# Patient Record
Sex: Female | Born: 1975 | Race: White | Hispanic: No | Marital: Single | Smoking: Current every day smoker
Health system: Southern US, Community
[De-identification: ages and names within clinical notes are randomized; demographics above are authoritative.]

## PROBLEM LIST (undated history)

## (undated) DIAGNOSIS — Z3490 Encounter for supervision of normal pregnancy, unspecified, unspecified trimester: Secondary | ICD-10-CM

## (undated) DIAGNOSIS — F1410 Cocaine abuse, uncomplicated: Secondary | ICD-10-CM

## (undated) DIAGNOSIS — F329 Major depressive disorder, single episode, unspecified: Secondary | ICD-10-CM

## (undated) DIAGNOSIS — B009 Herpesviral infection, unspecified: Secondary | ICD-10-CM

## (undated) DIAGNOSIS — Z8619 Personal history of other infectious and parasitic diseases: Secondary | ICD-10-CM

## (undated) DIAGNOSIS — B999 Unspecified infectious disease: Secondary | ICD-10-CM

## (undated) DIAGNOSIS — F32A Depression, unspecified: Secondary | ICD-10-CM

## (undated) HISTORY — DX: Unspecified infectious disease: B99.9

## (undated) HISTORY — DX: Personal history of other infectious and parasitic diseases: Z86.19

## (undated) HISTORY — DX: Herpesviral infection, unspecified: B00.9

## (undated) HISTORY — PX: ABDOMINAL SURGERY: SHX537

## (undated) HISTORY — PX: ANKLE SURGERY: SHX546

---

## 2007-01-28 ENCOUNTER — Inpatient Hospital Stay (HOSPITAL_COMMUNITY): Admission: AD | Admit: 2007-01-28 | Discharge: 2007-01-30 | Payer: Self-pay | Admitting: Psychiatry

## 2007-01-28 ENCOUNTER — Ambulatory Visit: Payer: Self-pay | Admitting: Psychiatry

## 2008-12-12 ENCOUNTER — Emergency Department (HOSPITAL_COMMUNITY): Admission: EM | Admit: 2008-12-12 | Discharge: 2008-12-12 | Payer: Self-pay | Admitting: Emergency Medicine

## 2010-02-28 ENCOUNTER — Other Ambulatory Visit: Admission: RE | Admit: 2010-02-28 | Discharge: 2010-02-28 | Payer: Self-pay | Admitting: Obstetrics & Gynecology

## 2010-03-22 ENCOUNTER — Inpatient Hospital Stay (HOSPITAL_COMMUNITY): Admission: AD | Admit: 2010-03-22 | Discharge: 2010-03-25 | Payer: Self-pay | Admitting: Obstetrics & Gynecology

## 2010-03-22 ENCOUNTER — Ambulatory Visit: Payer: Self-pay | Admitting: Family

## 2010-03-22 ENCOUNTER — Inpatient Hospital Stay (HOSPITAL_COMMUNITY): Admission: AD | Admit: 2010-03-22 | Discharge: 2010-03-22 | Payer: Self-pay | Admitting: Family Medicine

## 2011-02-07 LAB — CBC
HCT: 27 % — ABNORMAL LOW (ref 36.0–46.0)
HCT: 36.3 % (ref 36.0–46.0)
Hemoglobin: 9.5 g/dL — ABNORMAL LOW (ref 12.0–15.0)
MCHC: 35 g/dL (ref 30.0–36.0)
Platelets: 244 10*3/uL (ref 150–400)
RBC: 3 MIL/uL — ABNORMAL LOW (ref 3.87–5.11)
RBC: 4.08 MIL/uL (ref 3.87–5.11)
RDW: 15.3 % (ref 11.5–15.5)
WBC: 15.5 10*3/uL — ABNORMAL HIGH (ref 4.0–10.5)

## 2011-03-06 LAB — GC/CHLAMYDIA PROBE AMP, GENITAL
Chlamydia, DNA Probe: NEGATIVE
GC Probe Amp, Genital: NEGATIVE

## 2011-03-06 LAB — RPR: RPR Ser Ql: NONREACTIVE

## 2011-03-06 LAB — URINALYSIS, ROUTINE W REFLEX MICROSCOPIC
Glucose, UA: NEGATIVE mg/dL
Hgb urine dipstick: NEGATIVE
Nitrite: NEGATIVE
pH: 5.5 (ref 5.0–8.0)

## 2011-03-06 LAB — URINE CULTURE

## 2011-03-06 LAB — WET PREP, GENITAL
WBC, Wet Prep HPF POC: NONE SEEN
Yeast Wet Prep HPF POC: NONE SEEN

## 2011-07-04 ENCOUNTER — Emergency Department (HOSPITAL_COMMUNITY)
Admission: EM | Admit: 2011-07-04 | Discharge: 2011-07-04 | Disposition: A | Discharge status: Home / Self Care | Payer: Medicaid Other | Attending: Emergency Medicine | Admitting: Emergency Medicine

## 2011-07-04 ENCOUNTER — Encounter: Payer: Self-pay | Admitting: *Deleted

## 2011-07-04 DIAGNOSIS — X58XXXA Exposure to other specified factors, initial encounter: Secondary | ICD-10-CM | POA: Insufficient documentation

## 2011-07-04 DIAGNOSIS — IMO0002 Reserved for concepts with insufficient information to code with codable children: Secondary | ICD-10-CM | POA: Insufficient documentation

## 2011-07-04 DIAGNOSIS — F17200 Nicotine dependence, unspecified, uncomplicated: Secondary | ICD-10-CM | POA: Insufficient documentation

## 2011-07-04 NOTE — ED Provider Notes (Signed)
Medical screening examination/treatment/procedure(s) were performed by non-physician practitioner and as supervising physician I was immediately available for consultation/collaboration.   Vida Roller, MD 07/04/11 [phone]

## 2011-07-04 NOTE — ED Notes (Signed)
PA in with pt 

## 2011-07-04 NOTE — ED Notes (Signed)
Pt c/o right arm pain. Pt states it feels like she pulled a muscle.

## 2011-07-04 NOTE — ED Provider Notes (Signed)
History     CSN: [id number] Arrival date & time: 07/04/2011 10:44 AM  Chief Complaint  Patient presents with  . Arm Pain    right arm   Patient is a 35 y.o. female presenting with arm pain. The history is provided by the patient.  Arm Pain This is a new problem. The problem has been gradually worsening. Associated symptoms include arthralgias. Pertinent negatives include no abdominal pain, chest pain, coughing, fever, joint swelling or neck pain. Exacerbated by: certain movement. She has tried acetaminophen and NSAIDs for the symptoms. The treatment provided no relief.    History reviewed. No pertinent past medical history.  Past Surgical History  Procedure Date  . Ankle surgery   . Abdominal surgery     History reviewed. No pertinent family history.  History  Substance Use Topics  . Smoking status: Current Everyday Smoker -- 0.5 packs/day  . Smokeless tobacco: Not on file  . Alcohol Use: No    OB History    Grav Para Term Preterm Abortions TAB SAB Ect Mult Living                  Review of Systems  Constitutional: Negative for fever and activity change.       All ROS Neg except as noted in HPI  HENT: Negative for nosebleeds and neck pain.   Eyes: Negative for photophobia and discharge.  Respiratory: Negative for cough, shortness of breath and wheezing.   Cardiovascular: Negative for chest pain and palpitations.  Gastrointestinal: Negative for abdominal pain and blood in stool.  Genitourinary: Negative for dysuria, frequency and hematuria.  Musculoskeletal: Positive for arthralgias. Negative for back pain and joint swelling.  Skin: Negative.   Neurological: Negative for dizziness, seizures and speech difficulty.  Psychiatric/Behavioral: Negative for hallucinations and confusion.    Physical Exam  BP 106/71  Pulse 76  Temp(Src) 97.7 F (36.5 C) (Oral)  Resp 20  Ht 5\' 3"  (1.6 m)  Wt 155 lb (70.308 kg)  BMI 27.46 kg/m2  SpO2 100%  LMP 06/14/2011  Physical  Exam  Nursing note and vitals reviewed. Constitutional: She is oriented to person, place, and time. She appears well-developed and well-nourished.  Non-toxic appearance.  HENT:  Head: Normocephalic.  Right Ear: Tympanic membrane and external ear normal.  Left Ear: Tympanic membrane and external ear normal.  Eyes: EOM and lids are normal. Pupils are equal, round, and reactive to light.  Neck: Normal range of motion. Neck supple. Carotid bruit is not present.       No carotid bruits.  Cardiovascular: Normal rate, regular rhythm, normal heart sounds, intact distal pulses and normal pulses.   Pulmonary/Chest: Breath sounds normal. No respiratory distress.  Abdominal: Soft. Bowel sounds are normal. There is no tenderness. There is no guarding.  Musculoskeletal: Normal range of motion.       Pain with stressing the right shoulder and upper arm. No dislocation. Good cap refill of the right upper ext. FROM of the fingers, wrist and elbow.  Lymphadenopathy:       Head (right side): No submandibular adenopathy present.       Head (left side): No submandibular adenopathy present.    She has no cervical adenopathy.  Neurological: She is alert and oriented to person, place, and time. She has normal strength. No cranial nerve deficit or sensory deficit.  Skin: Skin is warm and dry.  Psychiatric: She has a normal mood and affect. Her speech is normal.    ED Course  Procedures  MDM I have reviewed nursing notes, vital signs, and all appropriate lab and imaging results for this patient.      Kathie Dike, [place] 07/04/11 1124

## 2011-07-19 ENCOUNTER — Emergency Department (HOSPITAL_COMMUNITY)
Admission: EM | Admit: 2011-07-19 | Discharge: 2011-07-19 | Disposition: A | Discharge status: Home / Self Care | Payer: Medicaid Other | Attending: Emergency Medicine | Admitting: Emergency Medicine

## 2011-07-19 ENCOUNTER — Encounter (HOSPITAL_COMMUNITY): Payer: Self-pay | Admitting: Emergency Medicine

## 2011-07-19 DIAGNOSIS — T07XXXA Unspecified multiple injuries, initial encounter: Secondary | ICD-10-CM

## 2011-07-19 DIAGNOSIS — W19XXXA Unspecified fall, initial encounter: Secondary | ICD-10-CM | POA: Insufficient documentation

## 2011-07-19 MED ORDER — IBUPROFEN 800 MG PO TABS: 800.0000 mg | ORAL_TABLET | Freq: Three times a day (TID) | ORAL | Status: AC

## 2011-07-19 MED ORDER — HYDROCODONE-ACETAMINOPHEN 5-325 MG PO TABS: ORAL_TABLET | ORAL | Status: DC

## 2011-07-19 NOTE — ED Notes (Signed)
Patient reports fall this morning down seven stairs at home. Patient reports losing her balance. Denies LOC. States she hit the right side of her head on concrete. Patient reports back pain and left foot/ankle pain. Patient reports she got dizzy at work after the fall. Denies nausea, vomiting, denies vision changes. Patient alert/oriented.

## 2011-07-19 NOTE — ED Notes (Deleted)
Re: visit from 07/04/11  Medical screening examination/treatment/procedure(s) were performed by non-physician practitioner and as supervising physician I was immediately available for consultation/collaboration.   Vida Roller, MD 07/19/11 [phone]

## 2011-11-17 ENCOUNTER — Emergency Department (HOSPITAL_COMMUNITY): Admission: EM | Admit: 2011-11-17 | Discharge: 2011-11-17 | Discharge status: Against Medical Advice | Payer: Self-pay

## 2011-11-21 NOTE — L&D Delivery Note (Signed)
Delivery Note At 2:22 AM a viable female was delivered via  (Presentation:ROA).  APGAR: 7/9, ; weight .  6# 7oz Placenta status: intact, spontaneous .  Cord: tight Nuchal x1, body cord x1, 3 vessel cord   thick mec at delivery.   Maternal UDS + cocaine on the 10/27  Anesthesia: epidural   Episiotomy: None Lacerations: rt. Labial abrasion Suture Repair: none\ Est. Blood Loss (mL):  Mom to postpartum.  Baby to mother's chest. Needed stimulation and blow-by O2. NICU called to room.   Kuneff, Renee 09/18/2012, 2:40 AM

## 2012-01-29 ENCOUNTER — Emergency Department (HOSPITAL_COMMUNITY)
Admission: EM | Admit: 2012-01-29 | Discharge: 2012-01-29 | Disposition: A | Discharge status: Home / Self Care | Payer: Self-pay | Attending: Emergency Medicine | Admitting: Emergency Medicine

## 2012-01-29 ENCOUNTER — Encounter (HOSPITAL_COMMUNITY): Payer: Self-pay | Admitting: *Deleted

## 2012-01-29 ENCOUNTER — Emergency Department (HOSPITAL_COMMUNITY): Payer: Self-pay

## 2012-01-29 DIAGNOSIS — O210 Mild hyperemesis gravidarum: Secondary | ICD-10-CM | POA: Insufficient documentation

## 2012-01-29 DIAGNOSIS — M545 Low back pain, unspecified: Secondary | ICD-10-CM | POA: Insufficient documentation

## 2012-01-29 DIAGNOSIS — R10814 Left lower quadrant abdominal tenderness: Secondary | ICD-10-CM | POA: Insufficient documentation

## 2012-01-29 DIAGNOSIS — R10813 Right lower quadrant abdominal tenderness: Secondary | ICD-10-CM | POA: Insufficient documentation

## 2012-01-29 DIAGNOSIS — A5400 Gonococcal infection of lower genitourinary tract, unspecified: Secondary | ICD-10-CM | POA: Insufficient documentation

## 2012-01-29 DIAGNOSIS — O2340 Unspecified infection of urinary tract in pregnancy, unspecified trimester: Secondary | ICD-10-CM

## 2012-01-29 DIAGNOSIS — O26899 Other specified pregnancy related conditions, unspecified trimester: Secondary | ICD-10-CM

## 2012-01-29 DIAGNOSIS — N390 Urinary tract infection, site not specified: Secondary | ICD-10-CM | POA: Insufficient documentation

## 2012-01-29 DIAGNOSIS — F17200 Nicotine dependence, unspecified, uncomplicated: Secondary | ICD-10-CM | POA: Insufficient documentation

## 2012-01-29 DIAGNOSIS — R059 Cough, unspecified: Secondary | ICD-10-CM | POA: Insufficient documentation

## 2012-01-29 DIAGNOSIS — R450 Nervousness: Secondary | ICD-10-CM | POA: Insufficient documentation

## 2012-01-29 DIAGNOSIS — O98219 Gonorrhea complicating pregnancy, unspecified trimester: Secondary | ICD-10-CM | POA: Insufficient documentation

## 2012-01-29 DIAGNOSIS — R05 Cough: Secondary | ICD-10-CM | POA: Insufficient documentation

## 2012-01-29 DIAGNOSIS — O2390 Unspecified genitourinary tract infection in pregnancy, unspecified trimester: Secondary | ICD-10-CM | POA: Insufficient documentation

## 2012-01-29 DIAGNOSIS — R109 Unspecified abdominal pain: Secondary | ICD-10-CM | POA: Insufficient documentation

## 2012-01-29 DIAGNOSIS — N949 Unspecified condition associated with female genital organs and menstrual cycle: Secondary | ICD-10-CM | POA: Insufficient documentation

## 2012-01-29 DIAGNOSIS — R42 Dizziness and giddiness: Secondary | ICD-10-CM | POA: Insufficient documentation

## 2012-01-29 LAB — CBC
Hemoglobin: 13.2 g/dL (ref 12.0–15.0)
MCH: 29.7 pg (ref 26.0–34.0)
MCV: 88.1 fL (ref 78.0–100.0)
RBC: 4.44 MIL/uL (ref 3.87–5.11)
RDW: 14.3 % (ref 11.5–15.5)

## 2012-01-29 LAB — ABO/RH: ABO/RH(D): A POS

## 2012-01-29 LAB — URINALYSIS, ROUTINE W REFLEX MICROSCOPIC
Nitrite: POSITIVE — AB
Specific Gravity, Urine: >1.03 — ABNORMAL HIGH (ref 1.005–1.030)
Urobilinogen, UA: 0.2 mg/dL (ref 0.0–1.0)

## 2012-01-29 LAB — DIFFERENTIAL
Basophils Absolute: 0 10*3/uL (ref 0.0–0.1)
Eosinophils Absolute: 0.1 10*3/uL (ref 0.0–0.7)
Lymphs Abs: 1.7 10*3/uL (ref 0.7–4.0)
Monocytes Relative: 11 % (ref 3–12)
Neutrophils Relative %: 66 % (ref 43–77)

## 2012-01-29 LAB — PREGNANCY, URINE: Preg Test, Ur: POSITIVE — AB

## 2012-01-29 LAB — WET PREP, GENITAL

## 2012-01-29 LAB — URINE MICROSCOPIC-ADD ON

## 2012-01-29 MED ORDER — NITROFURANTOIN MONOHYD MACRO 100 MG PO CAPS: 100.0000 mg | ORAL_CAPSULE | Freq: Two times a day (BID) | ORAL | Status: AC

## 2012-01-29 NOTE — ED Notes (Signed)
Pt taken to US. Nad.  

## 2012-01-29 NOTE — ED Notes (Signed)
Positive preg test at home.  Low abd pain.  Alert,

## 2012-01-29 NOTE — ED Provider Notes (Signed)
History     CSN: [id number]  Arrival date & time 01/29/12  1432   First MD Initiated Contact with Patient 01/29/12 1529      Chief Complaint  Patient presents with  . Abdominal Pain    HPI Joy Juarez is a 36 y.o. female who presents to the ED for abdominal pain in early pregnancy. The pain started last week and felt like period cramps. Denies vaginal bleeding but has had discharge. Has had a few episodes of nausea and vomiting. Having breast tenderness and low back pain. LMP 12/15/11. The history was provided by the patient. History reviewed. No pertinent past medical history.  Past Surgical History  Procedure Date  . Ankle surgery   . Abdominal surgery     History reviewed. No pertinent family history.  History  Substance Use Topics  . Smoking status: Current Everyday Smoker -- 0.5 packs/day  . Smokeless tobacco: Not on file  . Alcohol Use: No    OB History    Grav Para Term Preterm Abortions TAB SAB Ect Mult Living                  Review of Systems  Constitutional: Negative for fever, chills, diaphoresis and fatigue.  HENT: Negative for ear pain, congestion, sore throat, facial swelling, neck pain, neck stiffness, dental problem and sinus pressure.   Eyes: Negative for photophobia, pain and discharge.  Respiratory: Positive for cough. Negative for chest tightness and wheezing.   Cardiovascular: Negative.   Gastrointestinal: Positive for nausea, vomiting and abdominal pain. Negative for diarrhea, constipation and abdominal distention.  Genitourinary: Positive for frequency, vaginal discharge and pelvic pain. Negative for dysuria, flank pain, vaginal bleeding and difficulty urinating.  Musculoskeletal: Positive for back pain. Negative for myalgias and gait problem.  Skin: Negative for color change and rash.  Neurological: Positive for dizziness. Negative for speech difficulty, weakness, light-headedness, numbness and headaches.  Psychiatric/Behavioral: Negative for  confusion and agitation. The patient is nervous/anxious.     Allergies  Penicillins  Home Medications   Current Outpatient Rx  Name Route Sig Dispense Refill  . DROSPIRENONE-ETHINYL ESTRADIOL 3-0.03 MG PO TABS Oral Take 1 tablet by mouth daily.      Marland Kitchen HYDROCODONE-ACETAMINOPHEN 5-325 MG PO TABS  1 or 2 po q4h prn pain 15 tablet 0  . ONE-DAILY MULTI VITAMINS PO TABS Oral Take 1 tablet by mouth daily.        BP 96/71  Pulse 98  Temp(Src) 98 F (36.7 C) (Oral)  Resp 20  Ht 5\' 3"  (1.6 m)  Wt 125 lb (56.7 kg)  BMI 22.14 kg/m2  SpO2 100%  LMP 12/10/2011  Physical Exam  Nursing note and vitals reviewed. Constitutional: She is oriented to person, place, and time. She appears well-developed and well-nourished.  HENT:  Head: Normocephalic.  Eyes: EOM are normal.  Neck: Neck supple.  Cardiovascular: Normal rate.   Pulmonary/Chest: Effort normal.  Abdominal: Soft. There is tenderness in the right lower quadrant and left lower quadrant. There is no rigidity, no rebound, no guarding and no CVA tenderness.  Genitourinary:       External genitalia without lesions. White discharge vaginal vault. Cervix closed, long, no CMT, no adnexal tenderness. Uterus slightly enlarged.  Musculoskeletal: Normal range of motion.  Neurological: She is alert and oriented to person, place, and time. No cranial nerve deficit.  Skin: Skin is warm and dry.  Psychiatric: Her behavior is normal. Judgment and thought content normal.   Results for  orders placed during the hospital encounter of 01/29/12 (from the past 24 hour(s))  URINALYSIS, ROUTINE W REFLEX MICROSCOPIC     Status: Abnormal   Collection Time   01/29/12  2:49 PM      Component Value Range   Color, Urine YELLOW  YELLOW    APPearance HAZY (*) CLEAR    Specific Gravity, Urine >1.030 (*) 1.005 - 1.030    pH 5.5  5.0 - 8.0    Glucose, UA NEGATIVE  NEGATIVE (mg/dL)   Hgb urine dipstick NEGATIVE  NEGATIVE    Bilirubin Urine NEGATIVE  NEGATIVE     Ketones, ur NEGATIVE  NEGATIVE (mg/dL)   Protein, ur NEGATIVE  NEGATIVE (mg/dL)   Urobilinogen, UA 0.2  0.0 - 1.0 (mg/dL)   Nitrite POSITIVE (*) NEGATIVE    Leukocytes, UA TRACE (*) NEGATIVE   PREGNANCY, URINE     Status: Abnormal   Collection Time   01/29/12  2:49 PM      Component Value Range   Preg Test, Ur POSITIVE (*) NEGATIVE   URINE MICROSCOPIC-ADD ON     Status: Abnormal   Collection Time   01/29/12  2:49 PM      Component Value Range   Squamous Epithelial / LPF FEW (*) RARE    WBC, UA 11-20  <3 (WBC/hpf)   Bacteria, UA MANY (*) RARE   CBC     Status: Normal   Collection Time   01/29/12  3:47 PM      Component Value Range   WBC 7.7  4.0 - 10.5 (K/uL)   RBC 4.44  3.87 - 5.11 (MIL/uL)   Hemoglobin 13.2  12.0 - 15.0 (g/dL)   HCT 16.1  09.6 - 04.5 (%)   MCV 88.1  78.0 - 100.0 (fL)   MCH 29.7  26.0 - 34.0 (pg)   MCHC 33.8  30.0 - 36.0 (g/dL)   RDW 40.9  81.1 - 91.4 (%)   Platelets 256  150 - 400 (K/uL)  DIFFERENTIAL     Status: Normal   Collection Time   01/29/12  3:47 PM      Component Value Range   Neutrophils Relative 66  43 - 77 (%)   Neutro Abs 5.1  1.7 - 7.7 (K/uL)   Lymphocytes Relative 22  12 - 46 (%)   Lymphs Abs 1.7  0.7 - 4.0 (K/uL)   Monocytes Relative 11  3 - 12 (%)   Monocytes Absolute 0.8  0.1 - 1.0 (K/uL)   Eosinophils Relative 1  0 - 5 (%)   Eosinophils Absolute 0.1  0.0 - 0.7 (K/uL)   Basophils Relative 0  0 - 1 (%)   Basophils Absolute 0.0  0.0 - 0.1 (K/uL)  HCG, QUANTITATIVE, PREGNANCY     Status: Abnormal   Collection Time   01/29/12  3:47 PM      Component Value Range   hCG, Beta Chain, Quant, S 18227 (*) <5 (mIU/mL)  WET PREP, GENITAL     Status: Abnormal   Collection Time   01/29/12  4:05 PM      Component Value Range   Yeast Wet Prep HPF POC NONE SEEN  NONE SEEN    Trich, Wet Prep NONE SEEN  NONE SEEN    Clue Cells Wet Prep HPF POC FEW (*) NONE SEEN    WBC, Wet Prep HPF POC MANY (*) NONE SEEN     US Ob Comp Less 14 Wks  01/29/2012   *RADIOLOGY REPORT*  Clinical Data: Pelvic pain with positive pregnancy test.  OBSTETRIC <14 WK [place] AND TRANSVAGINAL OB US  Technique:  Both transabdominal and transvaginal ultrasound examinations were performed for complete evaluation of the gestation as well as the maternal uterus, adnexal regions, and pelvic cul-de-sac.  Transvaginal technique was performed to assess early pregnancy.  Comparison:  None.  Intrauterine gestational sac:  Single. Yolk sac: Visualized Embryo: Visualized Cardiac Activity: Visualized Heart Rate: 101 bpm  CRL: 4.3   mm  6   w  1.   d        [place] EDC: 09/22/2012  Maternal uterus/adnexae: Moderate subchorionic hemorrhage is identified.  Right-sided corpus luteum cyst is identified.  Left ovary is unremarkable.  There is a tiny amount of simple appearing free fluid posterior to the uterine fundus.  IMPRESSION: Single living intrauterine gestation at 6-week-1-day estimated gestational age.  Moderate subchorionic hemorrhage.  Original Report Authenticated By: ERIC A. MANSELL, M.D.   US Ob Transvaginal  01/29/2012  *RADIOLOGY REPORT*  Clinical Data: Pelvic pain with positive pregnancy test.  OBSTETRIC <14 WK [place] AND TRANSVAGINAL OB US  Technique:  Both transabdominal and transvaginal ultrasound examinations were performed for complete evaluation of the gestation as well as the maternal uterus, adnexal regions, and pelvic cul-de-sac.  Transvaginal technique was performed to assess early pregnancy.  Comparison:  None.  Intrauterine gestational sac:  Single. Yolk sac: Visualized Embryo: Visualized Cardiac Activity: Visualized Heart Rate: 101 bpm  CRL: 4.3   mm  6   w  1.   d        [place] EDC: 09/22/2012  Maternal uterus/adnexae: Moderate subchorionic hemorrhage is identified.  Right-sided corpus luteum cyst is identified.  Left ovary is unremarkable.  There is a tiny amount of simple appearing free fluid posterior to the uterine fundus.  IMPRESSION: Single living intrauterine gestation at 6-week-1-day  estimated gestational age.  Moderate subchorionic hemorrhage.  Original Report Authenticated By: ERIC A. MANSELL, M.D.   Assessment:  Viable IUP   UTI  Plan:  Marcobid Rx   Start prenatal care   Start prenatal vitamins   Pregnancy verification letter  ED Course  Procedures    MDM          Janne Napoleon, NP 01/29/12 1650

## 2012-01-29 NOTE — Discharge Instructions (Signed)
Start your prenatal care and prenatal vitamins.      ________________________________________     To schedule your Maternity Eligibility Appointment, please call [phone].  When you arrive for your appointment you must bring the following items or information listed below.  Your appointment will be rescheduled if you do not have these items or are 15 minutes late. If currently receiving Medicaid, you MUST bring: 1. Medicaid Card 2. Social Security Card 3. Picture ID 4. Proof of Pregnancy 5. Verification of current address if the address on Medicaid card is incorrect "postmarked mail" If not receiving Medicaid, you MUST bring: 1. Social Security Card 2. Picture ID 3. Birth Certificate (if available) Passport or *Green Card 4. Proof of Pregnancy 5. Verification of current address "postmarked mail" for each income presented. 6. Verification of insurance coverage, if any 7. Check stubs from each employer for the previous month (if unable to present check stub  for each week, we will accept check stub for the first and last week ill the same month.) If you can't locate check stubs, you must bring a letter from the employer(s) and it must have the following information on letterhead, typed, in English: o name of company o company telephone number o how long been with the company, if less than one month o how much person earns per hour o how many hours per week work o the gross pay the person earned for the previous month If you are 34 years old or less, you do not have to bring proof of income unless you work or live with the father of the baby and at that time we will need proof of income from you and/or the father of the baby. Green Card recipients are eligible for Medicaid for Pregnant Women (MPW)    Abdominal Pain During Pregnancy Abdominal discomfort is common in pregnancy. Most of the time, it does not cause harm. There are many causes of abdominal pain. Some causes are more  serious than others. Some of the causes of abdominal pain in pregnancy are easily diagnosed. Occasionally, the diagnosis takes time to understand. Other times, the cause is not determined. Abdominal pain can be a sign that something is very wrong with the pregnancy, or the pain may have nothing to do with the pregnancy at all. For this reason, always tell your caregiver if you have any abdominal discomfort. CAUSES Common and harmless causes of abdominal pain include:  Constipation.   Excess gas and bloating.   Round ligament pain. This is pain that is felt in the folds of the groin.   The position the baby or placenta is in.   Baby kicks.   Braxton-Hicks contractions. These are mild contractions that do not cause cervical dilation.  Serious causes of abdominal pain include:  Ectopic pregnancy. This happens when a fertilized egg implants outside of the uterus.   Miscarriage.   Preterm labor. This is when labor starts at less than 37 weeks of pregnancy.   Placental abruption. This is when the placenta partially or completely separates from the uterus.   Preeclampsia. This is often associated with high blood pressure and has been referred to as "toxemia in pregnancy."   Uterine or amniotic fluid infections.  Causes unrelated to pregnancy include:  Urinary tract infection.   Gallbladder stones or inflammation.   Hepatitis or other liver illness.   Intestinal problems, stomach flu, food poisoning, or ulcer.   Appendicitis.   Kidney (renal) stones.   Kidney infection (pylonephritis).  HOME CARE INSTRUCTIONS  For mild pain:  Do not have sexual intercourse or put anything in your vagina until your symptoms go away completely.   Get plenty of rest until your pain improves. If your pain does not improve in 1 hour, call your caregiver.   Drink clear fluids if you feel nauseous. Avoid solid food as long as you are uncomfortable or nauseous.   Only take medicine as directed by  your caregiver.   Keep all follow-up appointments with your caregiver.  SEEK IMMEDIATE MEDICAL CARE IF:  You are bleeding, leaking fluid, or passing tissue from the vagina.   You have increasing pain or cramping.   You have persistent vomiting.   You have painful or bloody urination.   You have a fever.   You notice a decrease in your baby's movements.   You have extreme weakness or feel faint.   You have shortness of breath, with or without abdominal pain.   You develop a severe headache with abdominal pain.   You have abnormal vaginal discharge with abdominal pain.   You have persistent diarrhea.   You have abdominal pain that continues even after rest, or gets worse.  MAKE SURE YOU:   Understand these instructions.   Will watch your condition.   Will get help right away if you are not doing well or get worse.  Document Released: 11/06/2005 Document Revised: 10/26/2011 Document Reviewed: 06/02/2011 [facility] Patient Information 2012 [place], [place].

## 2012-01-31 LAB — URINE CULTURE: Colony Count: >=100000

## 2012-01-31 LAB — GC/CHLAMYDIA PROBE AMP, GENITAL: GC Probe Amp, Genital: POSITIVE — AB

## 2012-01-31 NOTE — ED Provider Notes (Signed)
Medical screening examination/treatment/procedure(s) were performed by non-physician practitioner and as supervising physician I was immediately available for consultation/collaboration.  Nicoletta Dress. Colon Branch, MD 01/31/12 [id number]

## 2012-02-01 NOTE — ED Notes (Addendum)
+  Urine/positive GC Patient treated with Macrobid-sensitive to same-chart appended per protocol MD.Chart sent to EDP office for  STD rx.

## 2012-02-04 NOTE — ED Notes (Signed)
Chart returned from EDP office. Recommended that patient returns to ED for Rocephin 250 mg IM single injection for treatment of Gonorrhea. Follow up with OB/GYN for pregnancy. Continue Macrobid for UTI. Reviewed by Fayrene Helper PA-C. Attempted to call patient. No answer. Left message for patient to call back.

## 2012-02-05 NOTE — ED Notes (Signed)
Left message with contact person for patient to return call.

## 2012-03-06 ENCOUNTER — Encounter (HOSPITAL_COMMUNITY): Payer: Self-pay | Admitting: *Deleted

## 2012-03-06 ENCOUNTER — Emergency Department (HOSPITAL_COMMUNITY)
Admission: EM | Admit: 2012-03-06 | Discharge: 2012-03-06 | Discharge status: Against Medical Advice | Payer: Self-pay | Attending: Emergency Medicine | Admitting: Emergency Medicine

## 2012-03-06 DIAGNOSIS — O210 Mild hyperemesis gravidarum: Secondary | ICD-10-CM | POA: Insufficient documentation

## 2012-03-06 NOTE — ED Notes (Signed)
Pt reports she is [redacted] weeks pregnant and has been unable to keep solid food down for 3 days

## 2012-03-25 ENCOUNTER — Other Ambulatory Visit (HOSPITAL_COMMUNITY)
Admission: RE | Admit: 2012-03-25 | Discharge: 2012-03-25 | Disposition: A | Discharge status: Home / Self Care | Payer: Medicaid Other | Source: Ambulatory Visit | Attending: Obstetrics and Gynecology | Admitting: Obstetrics and Gynecology

## 2012-03-25 ENCOUNTER — Other Ambulatory Visit (HOSPITAL_COMMUNITY)
Admission: RE | Admit: 2012-03-25 | Payer: Medicaid Other | Source: Ambulatory Visit | Admitting: Obstetrics and Gynecology

## 2012-03-25 ENCOUNTER — Other Ambulatory Visit: Payer: Self-pay | Admitting: Adult Health

## 2012-03-25 DIAGNOSIS — Z113 Encounter for screening for infections with a predominantly sexual mode of transmission: Secondary | ICD-10-CM | POA: Insufficient documentation

## 2012-03-25 DIAGNOSIS — Z01419 Encounter for gynecological examination (general) (routine) without abnormal findings: Secondary | ICD-10-CM | POA: Insufficient documentation

## 2012-03-25 LAB — OB RESULTS CONSOLE ANTIBODY SCREEN: Antibody Screen: NEGATIVE

## 2012-03-25 LAB — OB RESULTS CONSOLE RPR: RPR: NONREACTIVE

## 2012-07-08 ENCOUNTER — Emergency Department (HOSPITAL_COMMUNITY)
Admission: EM | Admit: 2012-07-08 | Discharge: 2012-07-08 | Disposition: A | Discharge status: Home / Self Care | Payer: Medicaid Other | Attending: Emergency Medicine | Admitting: Emergency Medicine

## 2012-07-08 ENCOUNTER — Encounter (HOSPITAL_COMMUNITY): Payer: Self-pay | Admitting: *Deleted

## 2012-07-08 ENCOUNTER — Encounter (HOSPITAL_COMMUNITY): Payer: Medicaid Other | Attending: Obstetrics and Gynecology

## 2012-07-08 ENCOUNTER — Encounter (HOSPITAL_COMMUNITY): Payer: Self-pay

## 2012-07-08 VITALS — BP 110/65 | HR 110 | Temp 98.5°F | Resp 16

## 2012-07-08 DIAGNOSIS — N390 Urinary tract infection, site not specified: Secondary | ICD-10-CM | POA: Insufficient documentation

## 2012-07-08 DIAGNOSIS — B999 Unspecified infectious disease: Secondary | ICD-10-CM

## 2012-07-08 HISTORY — DX: Unspecified infectious disease: B99.9

## 2012-07-08 HISTORY — DX: Encounter for supervision of normal pregnancy, unspecified, unspecified trimester: Z34.90

## 2012-07-08 MED ORDER — DEXTROSE 5 % IV SOLN
1.0000 g | Freq: Once | INTRAVENOUS | Status: AC
  Administered 2012-07-08: 1 g via INTRAVENOUS
  Filled 2012-07-08: qty 10

## 2012-07-08 MED ORDER — SODIUM CHLORIDE 0.9 % IV SOLN
INTRAVENOUS | Status: DC
  Administered 2012-07-08: 16:00:00 via INTRAVENOUS

## 2012-07-08 MED ORDER — CEFTRIAXONE SODIUM 2 G IJ SOLR
2.0000 g | INTRAMUSCULAR | Status: DC
  Filled 2012-07-08 (×2): qty 2

## 2012-07-08 MED ORDER — SODIUM CHLORIDE 0.9 % IJ SOLN
10.0000 mL | INTRAMUSCULAR | Status: DC | PRN
  Administered 2012-07-08: 10 mL via INTRAVENOUS

## 2012-07-08 NOTE — Progress Notes (Signed)
Tolerated iv antibiotic well. 

## 2012-07-08 NOTE — ED Notes (Addendum)
Fever, chills, vomiting, dysuria, fever.  Pregnant .  Pt says she had to walk here.Goes to [employer]  No vag bleeding or d/c.

## 2012-09-03 LAB — OB RESULTS CONSOLE GBS: GBS: POSITIVE

## 2012-09-15 ENCOUNTER — Inpatient Hospital Stay (HOSPITAL_COMMUNITY)
Admission: AD | Admit: 2012-09-15 | Discharge: 2012-09-15 | Disposition: A | Discharge status: Home / Self Care | Payer: Medicaid Other | Source: Ambulatory Visit | Attending: Obstetrics & Gynecology | Admitting: Obstetrics & Gynecology

## 2012-09-15 ENCOUNTER — Encounter (HOSPITAL_COMMUNITY): Payer: Self-pay | Admitting: *Deleted

## 2012-09-15 DIAGNOSIS — O479 False labor, unspecified: Secondary | ICD-10-CM

## 2012-09-15 LAB — RAPID URINE DRUG SCREEN, HOSP PERFORMED
Barbiturates: NOT DETECTED
Benzodiazepines: NOT DETECTED
Tetrahydrocannabinol: NOT DETECTED

## 2012-09-15 MED ORDER — AZITHROMYCIN 500 MG PO TABS: 1000.0000 mg | ORAL_TABLET | Freq: Once | ORAL | Status: DC

## 2012-09-15 NOTE — MAU Note (Signed)
Pt reports having ctx since last night getting stronger and closer together now 8-10 min apart. Good fetal movement and denies SROM or bleeding at this time.

## 2012-09-15 NOTE — ED Provider Notes (Signed)
First Provider Initiated Contact with Patient 09/15/12 1056      Chief Complaint:  Labor Eval Pt feels "tightening" "every so often" Vomited chlamydia medicine a few days ago and requests retreatment Joy Juarez is  36 y.o. G2P1001 at [redacted]w[redacted]d presents complaining of Labor Eval .  She states possible contractions are associated with none vaginal bleeding, intact membranes, along with active fetal movement.   Obstetrical/Gynecological History: Menstrual History: OB History    Grav Para Term Preterm Abortions TAB SAB Ect Mult Living   2 1 1  0 0 0 0 0 0 1      Menarche age: Patient's last menstrual period was 12/07/2011.     Past Medical History: Past Medical History  Diagnosis Date  . Pregnant   . Infection 07/08/2012    Past Surgical History: Past Surgical History  Procedure Date  . Ankle surgery   . Abdominal surgery     Family History: History reviewed. No pertinent family history.  Social History: History  Substance Use Topics  . Smoking status: Current Every Day Smoker -- 0.5 packs/day  . Smokeless tobacco: Not on file  . Alcohol Use: No     Pt used crack Cocaine 3 days. Pt used Marijuana- 3 weeks ago. Pt says she uses crack every other day.     Allergies:  Allergies  Allergen Reactions  . Penicillins Nausea And Vomiting    Meds:  Prescriptions prior to admission  Medication Sig Dispense Refill  . acetaminophen (TYLENOL) 500 MG tablet Take 1,000 mg by mouth every 6 (six) hours as needed. pain      . omeprazole (PRILOSEC) 20 MG capsule Take 20 mg by mouth daily.      . valACYclovir (VALTREX) 1000 MG tablet Take 1,000 mg by mouth daily.      Marland Kitchen DISCONTD: azithromycin (ZITHROMAX) 500 MG tablet Take 1,000 mg by mouth once.        Review of Systems - Please refer to the aforementioned patients' reports.     Physical Exam  Blood pressure 114/78, pulse 91, temperature 97.6 F (36.4 C), temperature source Oral, resp. rate 18, height 5\' 3"  (1.6 m), weight  62.596 kg (138 lb), last menstrual period 12/07/2011. GENERAL: Well-developed, well-nourished female in no acute distress.  LUNGS: Clear to auscultation bilaterally.  HEART: Regular rate and rhythm. ABDOMEN: Soft, nontender, nondistended, gravid.  EXTREMITIES: Nontender, no edema, 2+ distal pulses. CERVICAL EXAM: Dilatation 3cm   Effacement 70%   Station -3   Presentation: cephalic FHT:  Baseline rate 130 bpm   Variability moderate  Accelerations present   Decelerations none Contractions: Every 10-12 mins   Labs: No results found for this or any previous visit (from the past 24 hour(s)). Imaging Studies:  No results found.  Assessment: Joy Juarez is  36 y.o. G2P1001 at [redacted]w[redacted]d presents with false vs early labor.  Pt was observen for 1.5 hours without cervical change or regular contractions.  Plan: D/c home.  Retreat with azithromycin 1 gm PO.  CRESENZO-DISHMAN,Joy Juarez 10/27/201312:27 PM   UDS + cocaine.  Pt left before results could be discussed.  Pt admits to using cocaine antenatally and is concerned that baby may be removed from home.  Pt aware that a social service consult will be initiated.  Has been counseled previously in office regarding dangers of cocaine use in pregnancy

## 2012-09-15 NOTE — ED Provider Notes (Signed)
Attestation of Attending Supervision of Advanced Practitioner (CNM/NP): Evaluation and management procedures were performed by the Advanced Practitioner under my supervision and collaboration.  I have reviewed the Advanced Practitioner's note and chart, and I agree with the management and plan.  Demont Linford, MD, FACOG Attending Obstetrician & Gynecologist Faculty Practice, [facility]  

## 2012-09-15 NOTE — Progress Notes (Signed)
Pt requests Drenda Freeze come down to check her cervix. Drenda Freeze called and is in a delivery at this time. Orders received for RN to check patients cervix if OK with patient.

## 2012-09-18 ENCOUNTER — Encounter (HOSPITAL_COMMUNITY): Payer: Self-pay

## 2012-09-18 ENCOUNTER — Encounter (HOSPITAL_COMMUNITY): Payer: Self-pay | Admitting: Anesthesiology

## 2012-09-18 ENCOUNTER — Inpatient Hospital Stay (HOSPITAL_COMMUNITY)
Admission: AD | Admit: 2012-09-18 | Discharge: 2012-09-20 | DRG: 775 | Disposition: A | Discharge status: Home / Self Care | Payer: Medicaid Other | Source: Ambulatory Visit | Attending: Obstetrics & Gynecology | Admitting: Obstetrics & Gynecology

## 2012-09-18 ENCOUNTER — Inpatient Hospital Stay (HOSPITAL_COMMUNITY): Payer: Medicaid Other | Admitting: Anesthesiology

## 2012-09-18 DIAGNOSIS — B999 Unspecified infectious disease: Secondary | ICD-10-CM

## 2012-09-18 DIAGNOSIS — O99344 Other mental disorders complicating childbirth: Secondary | ICD-10-CM

## 2012-09-18 DIAGNOSIS — Z22330 Carrier of Group B streptococcus: Secondary | ICD-10-CM

## 2012-09-18 DIAGNOSIS — O9989 Other specified diseases and conditions complicating pregnancy, childbirth and the puerperium: Secondary | ICD-10-CM

## 2012-09-18 DIAGNOSIS — IMO0001 Reserved for inherently not codable concepts without codable children: Secondary | ICD-10-CM

## 2012-09-18 DIAGNOSIS — F1410 Cocaine abuse, uncomplicated: Secondary | ICD-10-CM

## 2012-09-18 DIAGNOSIS — O99892 Other specified diseases and conditions complicating childbirth: Secondary | ICD-10-CM | POA: Diagnosis present

## 2012-09-18 LAB — CBC
Platelets: 170 10*3/uL (ref 150–400)
RBC: 4 MIL/uL (ref 3.87–5.11)
WBC: 13.9 10*3/uL — ABNORMAL HIGH (ref 4.0–10.5)

## 2012-09-18 LAB — RAPID URINE DRUG SCREEN, HOSP PERFORMED
Amphetamines: NOT DETECTED
Barbiturates: NOT DETECTED
Benzodiazepines: NOT DETECTED
Cocaine: POSITIVE — AB

## 2012-09-18 LAB — RPR: RPR Ser Ql: NONREACTIVE

## 2012-09-18 MED ORDER — PHENYLEPHRINE 40 MCG/ML (10ML) SYRINGE FOR IV PUSH (FOR BLOOD PRESSURE SUPPORT)
80.0000 ug | PREFILLED_SYRINGE | INTRAVENOUS | Status: DC | PRN
  Filled 2012-09-18: qty 5

## 2012-09-18 MED ORDER — LIDOCAINE HCL (PF) 1 % IJ SOLN
30.0000 mL | INTRAMUSCULAR | Status: DC | PRN
  Filled 2012-09-18: qty 30

## 2012-09-18 MED ORDER — LANOLIN HYDROUS EX OINT: TOPICAL_OINTMENT | CUTANEOUS | Status: DC | PRN

## 2012-09-18 MED ORDER — PRENATAL MULTIVITAMIN CH
1.0000 | ORAL_TABLET | Freq: Every day | ORAL | Status: DC
  Administered 2012-09-18 – 2012-09-20 (×2): 1 via ORAL
  Filled 2012-09-18 (×2): qty 1

## 2012-09-18 MED ORDER — IBUPROFEN 600 MG PO TABS: 600.0000 mg | ORAL_TABLET | Freq: Four times a day (QID) | ORAL | Status: DC | PRN

## 2012-09-18 MED ORDER — EPHEDRINE 5 MG/ML INJ
10.0000 mg | INTRAVENOUS | Status: DC | PRN
  Filled 2012-09-18: qty 4

## 2012-09-18 MED ORDER — NALOXONE HCL 0.4 MG/ML IJ SOLN
INTRAMUSCULAR | Status: AC
  Filled 2012-09-18: qty 1

## 2012-09-18 MED ORDER — PHENYLEPHRINE 40 MCG/ML (10ML) SYRINGE FOR IV PUSH (FOR BLOOD PRESSURE SUPPORT): 80.0000 ug | PREFILLED_SYRINGE | INTRAVENOUS | Status: DC | PRN

## 2012-09-18 MED ORDER — OXYTOCIN BOLUS FROM INFUSION
500.0000 mL | Freq: Once | INTRAVENOUS | Status: AC
  Administered 2012-09-18: 500 mL via INTRAVENOUS
  Filled 2012-09-18: qty 500

## 2012-09-18 MED ORDER — ONDANSETRON HCL 4 MG/2ML IJ SOLN: 4.0000 mg | INTRAMUSCULAR | Status: DC | PRN

## 2012-09-18 MED ORDER — FENTANYL 2.5 MCG/ML BUPIVACAINE 1/10 % EPIDURAL INFUSION (WH - ANES)
14.0000 mL/h | INTRAMUSCULAR | Status: DC
  Filled 2012-09-18: qty 125

## 2012-09-18 MED ORDER — DIPHENHYDRAMINE HCL 25 MG PO CAPS: 25.0000 mg | ORAL_CAPSULE | Freq: Four times a day (QID) | ORAL | Status: DC | PRN

## 2012-09-18 MED ORDER — BENZOCAINE-MENTHOL 20-0.5 % EX AERO: 1.0000 "application " | INHALATION_SPRAY | CUTANEOUS | Status: DC | PRN

## 2012-09-18 MED ORDER — IBUPROFEN 600 MG PO TABS
600.0000 mg | ORAL_TABLET | Freq: Four times a day (QID) | ORAL | Status: DC
  Administered 2012-09-18 – 2012-09-20 (×9): 600 mg via ORAL
  Filled 2012-09-18 (×10): qty 1

## 2012-09-18 MED ORDER — SIMETHICONE 80 MG PO CHEW
80.0000 mg | CHEWABLE_TABLET | ORAL | Status: DC | PRN
  Administered 2012-09-18: 80 mg via ORAL

## 2012-09-18 MED ORDER — TETANUS-DIPHTH-ACELL PERTUSSIS 5-2.5-18.5 LF-MCG/0.5 IM SUSP
0.5000 mL | Freq: Once | INTRAMUSCULAR | Status: AC
  Administered 2012-09-19: 0.5 mL via INTRAMUSCULAR

## 2012-09-18 MED ORDER — DIBUCAINE 1 % RE OINT: 1.0000 "application " | TOPICAL_OINTMENT | RECTAL | Status: DC | PRN

## 2012-09-18 MED ORDER — VALACYCLOVIR HCL 500 MG PO TABS: 1000.0000 mg | ORAL_TABLET | Freq: Every day | ORAL | Status: DC

## 2012-09-18 MED ORDER — ONDANSETRON HCL 4 MG PO TABS: 4.0000 mg | ORAL_TABLET | ORAL | Status: DC | PRN

## 2012-09-18 MED ORDER — LACTATED RINGERS IV SOLN
INTRAVENOUS | Status: DC
  Administered 2012-09-18: 02:00:00 via INTRAVENOUS

## 2012-09-18 MED ORDER — LIDOCAINE HCL (PF) 1 % IJ SOLN
INTRAMUSCULAR | Status: DC | PRN
  Administered 2012-09-18 (×2): 9 mL

## 2012-09-18 MED ORDER — FENTANYL CITRATE 0.05 MG/ML IJ SOLN
100.0000 ug | INTRAMUSCULAR | Status: DC | PRN
  Administered 2012-09-18: 100 ug via INTRAVENOUS

## 2012-09-18 MED ORDER — SODIUM CHLORIDE 0.9 % IV SOLN
2.0000 g | Freq: Once | INTRAVENOUS | Status: AC
  Administered 2012-09-18: 2 g via INTRAVENOUS
  Filled 2012-09-18: qty 2000

## 2012-09-18 MED ORDER — SENNOSIDES-DOCUSATE SODIUM 8.6-50 MG PO TABS
2.0000 | ORAL_TABLET | Freq: Every day | ORAL | Status: DC
  Administered 2012-09-18 – 2012-09-19 (×2): 2 via ORAL

## 2012-09-18 MED ORDER — PANTOPRAZOLE SODIUM 40 MG PO TBEC
40.0000 mg | DELAYED_RELEASE_TABLET | Freq: Every day | ORAL | Status: DC
  Filled 2012-09-18: qty 1

## 2012-09-18 MED ORDER — EPHEDRINE 5 MG/ML INJ: 10.0000 mg | INTRAVENOUS | Status: DC | PRN

## 2012-09-18 MED ORDER — ONDANSETRON HCL 4 MG/2ML IJ SOLN: 4.0000 mg | Freq: Four times a day (QID) | INTRAMUSCULAR | Status: DC | PRN

## 2012-09-18 MED ORDER — ACETAMINOPHEN 325 MG PO TABS: 650.0000 mg | ORAL_TABLET | ORAL | Status: DC | PRN

## 2012-09-18 MED ORDER — WITCH HAZEL-GLYCERIN EX PADS: 1.0000 "application " | MEDICATED_PAD | CUTANEOUS | Status: DC | PRN

## 2012-09-18 MED ORDER — OXYCODONE-ACETAMINOPHEN 5-325 MG PO TABS
1.0000 | ORAL_TABLET | ORAL | Status: DC | PRN
  Administered 2012-09-18 (×3): 1 via ORAL
  Administered 2012-09-18: 2 via ORAL
  Administered 2012-09-18 – 2012-09-19 (×2): 1 via ORAL
  Administered 2012-09-19: 2 via ORAL
  Administered 2012-09-19 – 2012-09-20 (×2): 1 via ORAL
  Filled 2012-09-18 (×2): qty 1
  Filled 2012-09-18 (×2): qty 2
  Filled 2012-09-18: qty 1
  Filled 2012-09-18: qty 2
  Filled 2012-09-18 (×4): qty 1

## 2012-09-18 MED ORDER — OXYCODONE-ACETAMINOPHEN 5-325 MG PO TABS: 1.0000 | ORAL_TABLET | ORAL | Status: DC | PRN

## 2012-09-18 MED ORDER — LACTATED RINGERS IV SOLN
500.0000 mL | Freq: Once | INTRAVENOUS | Status: AC
  Administered 2012-09-18: 500 mL via INTRAVENOUS

## 2012-09-18 MED ORDER — FENTANYL 2.5 MCG/ML BUPIVACAINE 1/10 % EPIDURAL INFUSION (WH - ANES)
INTRAMUSCULAR | Status: DC | PRN
  Administered 2012-09-18: 14 mL/h via EPIDURAL

## 2012-09-18 MED ORDER — CITRIC ACID-SODIUM CITRATE 334-500 MG/5ML PO SOLN: 30.0000 mL | ORAL | Status: DC | PRN

## 2012-09-18 MED ORDER — ZOLPIDEM TARTRATE 5 MG PO TABS: 5.0000 mg | ORAL_TABLET | Freq: Every evening | ORAL | Status: DC | PRN

## 2012-09-18 MED ORDER — LACTATED RINGERS IV SOLN: 500.0000 mL | INTRAVENOUS | Status: DC | PRN

## 2012-09-18 MED ORDER — VALACYCLOVIR HCL 500 MG PO TABS
1000.0000 mg | ORAL_TABLET | Freq: Every day | ORAL | Status: DC
  Filled 2012-09-18: qty 2

## 2012-09-18 MED ORDER — FENTANYL CITRATE 0.05 MG/ML IJ SOLN
INTRAMUSCULAR | Status: AC
  Filled 2012-09-18: qty 2

## 2012-09-18 MED ORDER — DIPHENHYDRAMINE HCL 50 MG/ML IJ SOLN: 12.5000 mg | INTRAMUSCULAR | Status: DC | PRN

## 2012-09-18 MED ORDER — OXYTOCIN 40 UNITS IN LACTATED RINGERS INFUSION - SIMPLE MED
62.5000 mL/h | INTRAVENOUS | Status: DC
  Filled 2012-09-18: qty 1000

## 2012-09-18 NOTE — Clinical Social Work Maternal (Signed)
Clinical Social Work Department PSYCHOSOCIAL ASSESSMENT - MATERNAL/CHILD 09/18/2012  Patient:  Joy Juarez, Joy Juarez  Account Number:  [id number]  Admit Date:  09/18/2012  Marjo Bicker Name:   Mila Merry    Clinical Social Worker:  Andy Gauss   Date/Time:  09/18/2012 12:12 PM  Date Referred:  09/18/2012   Referral source  CN     Referred reason  Substance Abuse   Other referral source:    I:  FAMILY / HOME ENVIRONMENT Child's legal guardian:  PARENT  Guardian - Name Guardian - Age Guardian - Address  Joy Juarez 18 [id number]; [place], [place] [address]  Joy Ravel. Vickki Hearing. 43 (same as above)   Other household support members/support persons Other support:   Joy Juarez, mother    II  PSYCHOSOCIAL DATA Information Source:    Event organiser Employment:   Surveyor, quantity resources:  [employer] If Medicaid - County:  [employer] [employer] Other  [facility]   School / Grade:   Maternity Care Coordinator / Child Services Coordination / Early Interventions:  Cultural issues impacting care:    III  STRENGTHS Strengths  Adequate Resources  Home prepared for Child (including basic supplies)  Supportive family/friends   Strength comment:    IV  RISK FACTORS AND CURRENT PROBLEMS Current Problem:  YES   Risk Factor & Current Problem Patient Issue Family Issue Risk Factor / Current Problem Comment   Y N MJ & cocaine    V  SOCIAL WORK ASSESSMENT Sw met with pt to assess her history of substance use.  Pt admits to a history of substance use, as she told Sw that she started smoking crack cocaine in 2011.  She admits to using off and on to date.  Pt told Sw that she smoked crack cocaine at least twice a week prior to pregnancy confirmation at 6 weeks.  Once pregnancy was confirmed she reports that she stopped using and was able to maintain sobriety until 06/2012.  When pt relapsed in August, she decreased use to once a week.  Pt states she last used on  Friday, September 13, 2012.  She also admits to occasional MJ use but states MJ is not her drug of choice.  She last smoked MJ, 2 1/2 weeks ago.  Sw explained hospital drug testing policy and informed pt that the infant tested positive for cocaine (UDS).  Pt was not surprised and became tearful, as expressed concern about her child being removed from her care.  Pt told Sw that she is willing to do whatever she needs to do, to keep her baby with her.  Sw advised pt that a CPS report would be made.  She denies any CPS history.  Pt has a daughter who lives with her mother, Joy Juarez.  She explained that she signed over temporary custody papers to her mother when she went to prison in 2011.  Pt was incarcerated for 6 months on financial card theft charges.  FOB is aware of pt's drug use however does not approve, according to pt.  FOB does not have a history of substance use, as per pt.  Pt also identified her mother as a good support person, who is also aware of pt's addiction.  Pt and FOB recently moved into a new apartment.  Pt is hopeful that since she moved out of her old neighborhood, she will remain clean.  Pt expressed interest in treatment and counseling to help her with addiction.  Sw will  provide pt with treatment/counseling resources.  She reports having all the necessary supplies for the infant.  Sw will refer to [facility] CPS and continue to follow until discharged.      VI SOCIAL WORK PLAN Social Work Plan  Child Management consultant Report   Type of pt/family education:   If child protective services report - county:  [place] If child protective services report - date:  09/18/2012 Information/referral to community resources comment:   Substance Abuse treatment facilities   Other social work plan:

## 2012-09-18 NOTE — Anesthesia Preprocedure Evaluation (Signed)

## 2012-09-18 NOTE — Anesthesia Postprocedure Evaluation (Signed)
  Anesthesia Post-op Note  Patient: Joy Juarez  Procedure(s) Performed: * No procedures listed *  Patient Location: PACU and Mother/Baby  Anesthesia Type:Epidural  Level of Consciousness: awake, alert  and oriented  Airway and Oxygen Therapy: Patient Spontanous Breathing  Post-op Pain: none  Post-op Assessment: Patient's Cardiovascular Status Stable, Respiratory Function Stable, Patent Airway, No signs of Nausea or vomiting, Adequate PO intake, Pain level controlled, No headache, No backache, No residual numbness and No residual motor weakness  Post-op Vital Signs: Reviewed and stable  Complications: No apparent anesthesia complications

## 2012-09-18 NOTE — H&P (Signed)
Joy Juarez is a 36 y.o. female G2P1001  [redacted]w[redacted]d presenting for painful contractions. She came in by squad, contractions every 2 minutes. She denies complications with this pregnancy, but admits to maternal drug use during her pregnancy. She has been positive for chlamydia a few days ago as well.   Maternal Medical History:  Reason for admission: Reason for admission: contractions.  Contractions: Frequency: regular.   Perceived severity is strong.      OB History    Grav Para Term Preterm Abortions TAB SAB Ect Mult Living   2 1 1  0 0 0 0 0 0 1     Past Medical History  Diagnosis Date  . Pregnant   . Infection 07/08/2012   Past Surgical History  Procedure Date  . Ankle surgery   . Abdominal surgery    Family History: family history is not on file. Social History:  reports that she has been smoking.  She does not have any smokeless tobacco history on file. She reports that she uses illicit drugs ("Crack" cocaine and Marijuana). She reports that she does not drink alcohol.   Prenatal Transfer Tool  Maternal Diabetes: No Genetic Screening: unknown Maternal Ultrasounds/Referrals: Normal Fetal Ultrasounds or other Referrals:  None Maternal Substance Abuse:  Yes:  Type: Cocaine Significant Maternal Medications:  None Significant Maternal Lab Results:  Lab values include: Group B Strep positive Other Comments:  None  Review of Systems  All other systems reviewed and are negative.      Blood pressure 133/92, pulse 104, resp. rate 22, last menstrual period 12/07/2011, SpO2 100.00%. Maternal Exam:  Uterine Assessment: Contraction strength is firm.  Contraction frequency is regular.   Abdomen: Fetal presentation: vertex  Introitus: Normal vulva. Normal vagina.  Vagina is negative for discharge.  Pelvis: adequate for delivery.      Physical Exam  Nursing note and vitals reviewed. Constitutional: She is oriented to person, place, and time. She appears well-developed. She  appears distressed.  Eyes: Pupils are equal, round, and reactive to light. Right eye exhibits no discharge. Left eye exhibits no discharge.  Cardiovascular: Regular rhythm, normal heart sounds and intact distal pulses.  Tachycardia present.   No murmur heard. Respiratory: Effort normal and breath sounds normal. She has no wheezes. She has no rales.  Genitourinary: Vagina normal and uterus normal. No vaginal discharge found.  Musculoskeletal: She exhibits no edema and no tenderness.  Neurological: She is alert and oriented to person, place, and time.  Psychiatric: Her mood appears anxious. Her affect is inappropriate. She is agitated.    Prenatal labs: ABO, Rh: --/--/A POS (03/11 1600) Antibody:   Rubella:   RPR:    HBsAg:    HIV:    GBS:   Positive  Assessment/Plan: 1. GBS positive- AMP 2. Admit to LD with routine orders 3. Anticipate NSVD  4. Positive UDS: cocaine  Kuneff, Renee 09/18/2012, 1:13 AM

## 2012-09-18 NOTE — Progress Notes (Signed)
Joy Juarez is a 36 y.o. G2P1001 at [redacted]w[redacted]d by LMP admitted for active labor  Subjective: Patient has just received her epidural.   Objective: BP 131/104  Pulse 114  Temp 97.4 F (36.3 C) (Oral)  Resp 22  Ht 5\' 3"  (1.6 m)  Wt 62.596 kg (138 lb)  BMI 24.45 kg/m2  SpO2 100%  LMP 12/07/2011      FHT:  FHR: 130 bpm, variability: moderate,  accelerations:  Present,  decelerations:  Absent UC:   regular, every 2 minutes SVE:  Dilation: 9 Effacement (%): 100 Station: 0 Presentation: Vertex Exam by:: dr Marina Goodell  Labs: Lab Results  Component Value Date   WBC 13.9* 09/18/2012   HGB 11.4* 09/18/2012   HCT 34.3* 09/18/2012   MCV 85.8 09/18/2012   PLT 170 09/18/2012    Assessment / Plan: Spontaneous labor, progressing normally  Labor: Progressing normally Preeclampsia:  labs stable Fetal Wellbeing:  Category I Pain Control:  Epidural and Fentanyl I/D:  n/a Anticipated MOD:  NSVD  Skylen Spiering 09/18/2012, 2:09 AM

## 2012-09-18 NOTE — Anesthesia Procedure Notes (Signed)
Epidural Patient location during procedure: OB Start time: 09/18/2012 1:52 AM End time: 09/18/2012 1:56 AM  Staffing Anesthesiologist: Sandrea Hughs Performed by: anesthesiologist   Preanesthetic Checklist Completed: patient identified, site marked, surgical consent, pre-op evaluation, timeout performed, IV checked, risks and benefits discussed and monitors and equipment checked  Epidural Patient position: sitting Prep: site prepped and draped and DuraPrep Patient monitoring: continuous pulse ox and blood pressure Approach: midline Injection technique: LOR air  Needle:  Needle type: Tuohy  Needle gauge: 17 G Needle length: 9 cm and 9 Needle insertion depth: 5 cm cm Catheter type: closed end flexible Catheter size: 19 Gauge Catheter at skin depth: 10 cm Test dose: negative and Other  Assessment Sensory level: T8 Events: blood not aspirated, injection not painful, no injection resistance, negative IV test and no paresthesia  Additional Notes Reason for block:procedure for pain

## 2012-09-18 NOTE — MAU Note (Signed)
Pt fast tracked to rm 163 due to advanced dilation

## 2012-09-19 LAB — GC/CHLAMYDIA PROBE AMP, URINE: GC Probe Amp, Urine: NEGATIVE

## 2012-09-19 MED ORDER — PNEUMOCOCCAL VAC POLYVALENT 25 MCG/0.5ML IJ INJ
0.5000 mL | INJECTION | INTRAMUSCULAR | Status: DC
  Filled 2012-09-19: qty 0.5

## 2012-09-19 MED ORDER — INFLUENZA VIRUS VACC SPLIT PF IM SUSP
0.5000 mL | INTRAMUSCULAR | Status: AC
  Administered 2012-09-19: 0.5 mL via INTRAMUSCULAR
  Filled 2012-09-19: qty 0.5

## 2012-09-19 NOTE — Progress Notes (Addendum)
PPD #1 SVD  S:  Reports feeling well, able to rest better last night since FOB stayed to care for baby.  Reports meeting with CSW yesterday and feeling very happy that baby will go home with her at discharge.             Tolerating po/ No nausea or vomiting             Bleeding is light             Pain controlled withnarcotic analgesics including oxycodone/acetaminophen (Percocet, Tylox)             Up ad lib / ambulatory  Newborn bottle feeding - increasing appetite  / Circumcision yes - not done    O:               VS: BP 95/57  Pulse 68  Temp 97.8 F (36.6 C) (Oral)  Resp 18  Ht 5\' 3"  (1.6 m)  Wt 62.596 kg (138 lb)  BMI 24.45 kg/m2  SpO2 98%  LMP 12/07/2011  Breastfeeding? Unknown   LABS:  Basename 09/18/12 0125  WBC 13.9*  HGB 11.4*  PLT 170                Physical Exam:             Alert and oriented X3  Lungs: Clear and unlabored  Heart: regular rate and rhythm / no mumurs  Abdomen: soft, non-tender, non-distended              Fundus: firm, non-tender, U-3  Perineum: intact, mild LT labia minora swelling noted  Lochia: light  Extremities: No edema, no calf pain or tenderness, neg Homan's sign    A: PPD # 1   Doing well - stable status  (+) substance abuse - crack cocaine  P:  Routine post partum orders  Desires discharge home tomorrow d/t new home preparations   Joy Juarez, SNM 09/19/2012, 7:41 AM Supervised by: Cathie Beams, CNM I have seen and examined this patient and agree the above assessment. CRESENZO-DISHMAN,FRANCES 09/19/2012 7:53 AM

## 2012-09-19 NOTE — Progress Notes (Signed)
CPS worker, Erica Harris came to assess pt & FOB yesterday evening. Erica completed a safety agreement with the parents stating that MOB would never be left alone with the infant. Sw placed a copy of agreement in infants chart. No barriers to discharge.  

## 2012-09-19 NOTE — Progress Notes (Signed)
I have seen and examined this patient and agree the above assessment. CRESENZO-DISHMAN,Norris Bodley 09/19/2012 7:59 AM

## 2012-09-19 NOTE — Progress Notes (Signed)
UR Chart review completed.  

## 2012-09-20 MED ORDER — IBUPROFEN 600 MG PO TABS: 600.0000 mg | ORAL_TABLET | Freq: Four times a day (QID) | ORAL | Status: DC

## 2012-09-20 MED ORDER — DIBUCAINE 1 % RE OINT: 1.0000 "application " | TOPICAL_OINTMENT | RECTAL | Status: DC | PRN

## 2012-09-20 MED ORDER — LANOLIN HYDROUS EX OINT: 1.0000 "application " | TOPICAL_OINTMENT | CUTANEOUS | Status: DC | PRN

## 2012-09-20 MED ORDER — WITCH HAZEL-GLYCERIN EX PADS: 1.0000 "application " | MEDICATED_PAD | CUTANEOUS | Status: DC | PRN

## 2012-09-20 MED ORDER — BENZOCAINE-MENTHOL 20-0.5 % EX AERO: 1.0000 "application " | INHALATION_SPRAY | CUTANEOUS | Status: DC | PRN

## 2012-09-20 NOTE — Discharge Summary (Signed)
Obstetric Discharge Summary Reason for Admission: onset of labor Prenatal Procedures: none Intrapartum Procedures: spontaneous vaginal delivery Postpartum Procedures: none Complications-Operative and Postpartum: mother and baby cocaine positive at delivery Hemoglobin  Date Value Range Status  09/18/2012 11.4* 12.0 - 15.0 g/dL Final     HCT  Date Value Range Status  09/18/2012 34.3* 36.0 - 46.0 % Final    Physical Exam:  Filed Vitals:   09/20/12 0532  BP: 97/59  Pulse: 76  Temp: 97.5 F (36.4 C)  Resp: 16    General: alert and cooperative Lochia: appropriate Uterine Fundus: firm Incision: none  DVT Evaluation: No evidence of DVT seen on physical exam. Negative Homan's sign. No cords or calf tenderness.  Discharge Diagnoses: Term Pregnancy-delivered  Discharge Information: Date: 09/20/2012 Activity: pelvic rest Diet: routine Medications: ibp Condition: stable Instructions: avs Discharge to: home Follow-up Information    Schedule an appointment as soon as possible for a visit in 6 weeks to follow up. (for your postpartum visit)        Due to cocaine history social worker and Stage manager evaluated pt and following note in chart: CPS worker, Eliseo Squires came to assess pt & FOB yesterday evening. Alcario Drought completed a safety agreement with the parents stating that MOB would never be left alone with the infant. Sw placed a copy of agreement in infants chart. No barriers to discharge.   Newborn Data: Live born female  Birth Weight: 6 lb 7.2 oz (2925 g) APGAR: 7, 9  Home with mother and father of baby.  Felix Pacini 09/20/2012, 10:41 AM  I examined pt and agree with documentation above and resident plan of care. [facility]

## 2012-09-20 NOTE — Discharge Summary (Signed)
Agree with above note.  LEGGETT,KELLY H. 09/20/2012 3:18 PM

## 2012-09-20 NOTE — Progress Notes (Signed)
Reminded patient and father of baby of safety plan set up for baby.  Both acknowlegd  Understanding this plan.

## 2012-11-01 ENCOUNTER — Emergency Department (HOSPITAL_COMMUNITY): Payer: Medicaid Other

## 2012-11-01 ENCOUNTER — Encounter (HOSPITAL_COMMUNITY): Payer: Self-pay | Admitting: *Deleted

## 2012-11-01 ENCOUNTER — Emergency Department (HOSPITAL_COMMUNITY)
Admission: EM | Admit: 2012-11-01 | Discharge: 2012-11-01 | Disposition: A | Discharge status: Home / Self Care | Payer: Medicaid Other | Attending: Emergency Medicine | Admitting: Emergency Medicine

## 2012-11-01 DIAGNOSIS — R079 Chest pain, unspecified: Secondary | ICD-10-CM | POA: Insufficient documentation

## 2012-11-01 DIAGNOSIS — R1013 Epigastric pain: Secondary | ICD-10-CM | POA: Insufficient documentation

## 2012-11-01 DIAGNOSIS — F17200 Nicotine dependence, unspecified, uncomplicated: Secondary | ICD-10-CM | POA: Insufficient documentation

## 2012-11-01 DIAGNOSIS — Z8619 Personal history of other infectious and parasitic diseases: Secondary | ICD-10-CM | POA: Insufficient documentation

## 2012-11-01 DIAGNOSIS — Z79899 Other long term (current) drug therapy: Secondary | ICD-10-CM | POA: Insufficient documentation

## 2012-11-01 DIAGNOSIS — M549 Dorsalgia, unspecified: Secondary | ICD-10-CM | POA: Insufficient documentation

## 2012-11-01 DIAGNOSIS — R109 Unspecified abdominal pain: Secondary | ICD-10-CM

## 2012-11-01 LAB — URINALYSIS, ROUTINE W REFLEX MICROSCOPIC
Bilirubin Urine: NEGATIVE
Glucose, UA: NEGATIVE mg/dL
Ketones, ur: NEGATIVE mg/dL
Protein, ur: NEGATIVE mg/dL
Specific Gravity, Urine: 1.02 (ref 1.005–1.030)
Urobilinogen, UA: 0.2 mg/dL (ref 0.0–1.0)

## 2012-11-01 LAB — CBC WITH DIFFERENTIAL/PLATELET
Basophils Relative: 1 % (ref 0–1)
Eosinophils Relative: 3 % (ref 0–5)
HCT: 42.2 % (ref 36.0–46.0)
Hemoglobin: 13.9 g/dL (ref 12.0–15.0)
Lymphocytes Relative: 33 % (ref 12–46)
Lymphs Abs: 2.8 10*3/uL (ref 0.7–4.0)
MCHC: 32.9 g/dL (ref 30.0–36.0)
MCV: 88.5 fL (ref 78.0–100.0)
Monocytes Absolute: 0.5 10*3/uL (ref 0.1–1.0)
Monocytes Relative: 5 % (ref 3–12)
Platelets: 338 10*3/uL (ref 150–400)
RBC: 4.77 MIL/uL (ref 3.87–5.11)
WBC: 8.5 10*3/uL (ref 4.0–10.5)

## 2012-11-01 LAB — COMPREHENSIVE METABOLIC PANEL
ALT: 18 U/L (ref 0–35)
Alkaline Phosphatase: 61 U/L (ref 39–117)
BUN: 18 mg/dL (ref 6–23)
CO2: 25 mEq/L (ref 19–32)
Calcium: 9.2 mg/dL (ref 8.4–10.5)
GFR calc Af Amer: >90 mL/min (ref >90)
GFR calc non Af Amer: >90 mL/min (ref >90)
Glucose, Bld: 118 mg/dL — ABNORMAL HIGH (ref 70–99)
Potassium: 4.2 mEq/L (ref 3.5–5.1)
Sodium: 139 mEq/L (ref 135–145)

## 2012-11-01 LAB — URINE MICROSCOPIC-ADD ON

## 2012-11-01 LAB — RAPID URINE DRUG SCREEN, HOSP PERFORMED
Amphetamines: NOT DETECTED
Barbiturates: NOT DETECTED
Tetrahydrocannabinol: NOT DETECTED

## 2012-11-01 MED ORDER — HYDROCODONE-ACETAMINOPHEN 5-325 MG PO TABS: 1.0000 | ORAL_TABLET | Freq: Four times a day (QID) | ORAL | Status: DC | PRN

## 2012-11-01 MED ORDER — RANITIDINE HCL 150 MG PO CAPS: 150.0000 mg | ORAL_CAPSULE | Freq: Two times a day (BID) | ORAL | Status: DC

## 2012-11-01 MED ORDER — OXYCODONE-ACETAMINOPHEN 5-325 MG PO TABS
1.0000 | ORAL_TABLET | Freq: Once | ORAL | Status: AC
  Administered 2012-11-01: 1 via ORAL
  Filled 2012-11-01: qty 1

## 2012-11-01 MED ORDER — PANTOPRAZOLE SODIUM 40 MG IV SOLR
40.0000 mg | Freq: Once | INTRAVENOUS | Status: AC
  Administered 2012-11-01: 40 mg via INTRAVENOUS
  Filled 2012-11-01: qty 40

## 2012-11-01 MED ORDER — LORAZEPAM 2 MG/ML IJ SOLN
INTRAMUSCULAR | Status: AC
  Administered 2012-11-01: 1 mg
  Filled 2012-11-01: qty 1

## 2012-11-01 MED ORDER — ONDANSETRON HCL 4 MG/2ML IJ SOLN
4.0000 mg | Freq: Once | INTRAMUSCULAR | Status: AC
  Administered 2012-11-01: 4 mg via INTRAVENOUS

## 2012-11-01 MED ORDER — HYDROMORPHONE HCL PF 1 MG/ML IJ SOLN
1.0000 mg | Freq: Once | INTRAMUSCULAR | Status: AC
  Administered 2012-11-01: 1 mg via INTRAVENOUS

## 2012-11-01 MED ORDER — HYDROMORPHONE HCL PF 1 MG/ML IJ SOLN
INTRAMUSCULAR | Status: AC
  Administered 2012-11-01: 1 mg via INTRAVENOUS
  Filled 2012-11-01: qty 1

## 2012-11-01 MED ORDER — ONDANSETRON HCL 4 MG/2ML IJ SOLN
INTRAMUSCULAR | Status: AC
  Administered 2012-11-01: 4 mg via INTRAVENOUS
  Filled 2012-11-01: qty 2

## 2012-11-01 MED ORDER — PROMETHAZINE HCL 25 MG PO TABS: 25.0000 mg | ORAL_TABLET | Freq: Four times a day (QID) | ORAL | Status: DC | PRN

## 2012-11-01 NOTE — ED Provider Notes (Signed)
History   This chart was scribed for Joy Lennert, MD, by [name], ER scribe. The patient was seen in room APA05/APA05 and the patient's care was started at 1014.    CSN: [id number]  Arrival date & time 11/01/12  1004   First MD Initiated Contact with Patient 11/01/12 1014      Chief Complaint  Patient presents with  . Chest Pain  . Abdominal Pain  . Back Pain    (Consider location/radiation/quality/duration/timing/severity/associated sxs/prior treatment) HPI Comments: Joy Juarez is a 36 y.o. female who presents to the Emergency Department complaining of constant, severe epigastric pain that radiates to the back and chest that began at 10:00. She reports that she had emesis 1x PTA. She states that the pain is aggravated by deep breaths. She denies any h/o of similar pain, cholecystectomy, or GERD.        Patient is a 36 y.o. female presenting with abdominal pain. The history is provided by the patient.  Abdominal Pain The primary symptoms of the illness include abdominal pain. The primary symptoms of the illness do not include fatigue or diarrhea. The current episode started less than 1 hour ago. The onset of the illness was sudden. The problem has not changed since onset. The abdominal pain began less than 1 hour ago. The pain came on suddenly. The abdominal pain is located in the epigastric region. The abdominal pain radiates to the chest and back. The abdominal pain is exacerbated by deep breathing.  Additional symptoms associated with the illness include back pain. Symptoms associated with the illness do not include hematuria or frequency. Significant associated medical issues do not include GERD.    Past Medical History  Diagnosis Date  . Pregnant   . Infection 07/08/2012    Past Surgical History  Procedure Date  . Ankle surgery   . Abdominal surgery     No family history on file.  History  Substance Use Topics  . Smoking status: Current Every Day  Smoker -- 0.5 packs/day    Types: Cigarettes  . Smokeless tobacco: Not on file  . Alcohol Use: No    OB History    Grav Para Term Preterm Abortions TAB SAB Ect Mult Living   2 2 2  0 0 0 0 0 0 2      Review of Systems  Constitutional: Negative for fatigue.  HENT: Negative for congestion, sinus pressure and ear discharge.   Eyes: Negative for discharge.  Respiratory: Negative for cough.   Cardiovascular: Positive for chest pain.  Gastrointestinal: Positive for abdominal pain. Negative for diarrhea.  Genitourinary: Negative for frequency and hematuria.  Musculoskeletal: Positive for back pain.  Skin: Negative for rash.  Neurological: Negative for seizures and headaches.  Hematological: Negative.   Psychiatric/Behavioral: Negative for hallucinations.  All other systems reviewed and are negative.    Allergies  Penicillins  Home Medications   Current Outpatient Rx  Name  Route  Sig  Dispense  Refill  . BENZOCAINE-MENTHOL 20-0.5 % EX AERO   Topical   Apply 1 application topically as needed (perineal discomfort).         . DIBUCAINE 1 % RE OINT   Rectal   Place 1 application rectally as needed.         . IBUPROFEN 600 MG PO TABS   Oral   Take 1 tablet (600 mg total) by mouth every 6 (six) hours.   30 tablet   0   . LANOLIN HYDROUS EX  OINT   Topical   Apply 1 application topically as needed (for breast care).         Marland Kitchen VALACYCLOVIR HCL 1 G PO TABS   Oral   Take 1,000 mg by mouth daily.         Weyman Croon HAZEL-GLYCERIN EX PADS   Topical   Apply 1 application topically as needed.   40 each        BP 79/60  Pulse 69  Temp 97.8 F (36.6 C)  Resp 19  SpO2 98%  LMP 11/01/2012  Breastfeeding? No  Physical Exam  Nursing note and vitals reviewed. Constitutional: She is oriented to person, place, and time. She appears well-developed.  HENT:  Head: Normocephalic and atraumatic.  Eyes: Conjunctivae normal and EOM are normal. No scleral icterus.  Neck:  Neck supple. No thyromegaly present.  Cardiovascular: Normal rate and regular rhythm.  Exam reveals no gallop and no friction rub.   No murmur heard. Pulmonary/Chest: No stridor. She has no wheezes. She has no rales. She exhibits no tenderness.  Abdominal: She exhibits no distension. There is tenderness. There is no rebound.       She has sever epigastric tenderness.  Musculoskeletal: Normal range of motion. She exhibits no edema.  Lymphadenopathy:    She has no cervical adenopathy.  Neurological: She is oriented to person, place, and time. Coordination normal.  Skin: No rash noted. No erythema.  Psychiatric: She has a normal mood and affect. Her behavior is normal.    ED Course  Procedures (including critical care time)  DIAGNOSTIC STUDIES: Oxygen Saturation is 100% on room air, normal by my interpretation.    COORDINATION OF CARE:  10:25- Discussed planned course of treatment with the patient, including a chest X-ray, UA, and blood work, who is agreeable at this time.  10:40- Medication Orders- Hydromorphone (DILAUDID) 1 MG/ML injection, Lorazepam (ATIVAN) 2 MG/ML injection, ondansetron (ZOFRAN) 4 MG/2ML injection.  10:45- Medication Orders- Pantoprazole (PROTONIX) injection 40 mg- Once, ondansetron (ZOFRAN) injection 4 mg- Once, Hydromorphone (DILAUDID) injection 1 mg- Once, ondansetron (ZOFRAN) injection 4 mg- Once.  Results for orders placed during the hospital encounter of 11/01/12  CBC WITH DIFFERENTIAL      Component Value Range   WBC 8.5  4.0 - 10.5 K/uL   RBC 4.77  3.87 - 5.11 MIL/uL   Hemoglobin 13.9  12.0 - 15.0 g/dL   HCT 95.6  21.3 - 08.6 %   MCV 88.5  78.0 - 100.0 fL   MCH 29.1  26.0 - 34.0 pg   MCHC 32.9  30.0 - 36.0 g/dL   RDW 57.8 (*) 46.9 - 62.9 %   Platelets 338  150 - 400 K/uL   Neutrophils Relative 58  43 - 77 %   Neutro Abs 4.9  1.7 - 7.7 K/uL   Lymphocytes Relative 33  12 - 46 %   Lymphs Abs 2.8  0.7 - 4.0 K/uL   Monocytes Relative 5  3 - 12 %    Monocytes Absolute 0.5  0.1 - 1.0 K/uL   Eosinophils Relative 3  0 - 5 %   Eosinophils Absolute 0.2  0.0 - 0.7 K/uL   Basophils Relative 1  0 - 1 %   Basophils Absolute 0.0  0.0 - 0.1 K/uL  COMPREHENSIVE METABOLIC PANEL      Component Value Range   Sodium 139  135 - 145 mEq/L   Potassium 4.2  3.5 - 5.1 mEq/L   Chloride 104  96 - 112 mEq/L   CO2 25  19 - 32 mEq/L   Glucose, Bld 118 (*) 70 - 99 mg/dL   BUN 18  6 - 23 mg/dL   Creatinine, Ser 1.61  0.50 - 1.10 mg/dL   Calcium 9.2  8.4 - 09.6 mg/dL   Total Protein 7.8  6.0 - 8.3 g/dL   Albumin 3.6  3.5 - 5.2 g/dL   AST 23  0 - 37 U/L   ALT 18  0 - 35 U/L   Alkaline Phosphatase 61  39 - 117 U/L   Total Bilirubin 0.2 (*) 0.3 - 1.2 mg/dL   GFR calc non Af Amer >90  >90 mL/min   GFR calc Af Amer >90  >90 mL/min  LIPASE, BLOOD      Component Value Range   Lipase 28  11 - 59 U/L  URINALYSIS, ROUTINE W REFLEX MICROSCOPIC      Component Value Range   Color, Urine YELLOW  YELLOW   APPearance CLEAR  CLEAR   Specific Gravity, Urine 1.020  1.005 - 1.030   pH 6.0  5.0 - 8.0   Glucose, UA NEGATIVE  NEGATIVE mg/dL   Hgb urine dipstick LARGE (*) NEGATIVE   Bilirubin Urine NEGATIVE  NEGATIVE   Ketones, ur NEGATIVE  NEGATIVE mg/dL   Protein, ur NEGATIVE  NEGATIVE mg/dL   Urobilinogen, UA 0.2  0.0 - 1.0 mg/dL   Nitrite NEGATIVE  NEGATIVE   Leukocytes, UA TRACE (*) NEGATIVE  PREGNANCY, URINE      Component Value Range   Preg Test, Ur NEGATIVE  NEGATIVE  URINE RAPID DRUG SCREEN (HOSP PERFORMED)      Component Value Range   Opiates NONE DETECTED  NONE DETECTED   Cocaine NONE DETECTED  NONE DETECTED   Benzodiazepines NONE DETECTED  NONE DETECTED   Amphetamines NONE DETECTED  NONE DETECTED   Tetrahydrocannabinol NONE DETECTED  NONE DETECTED   Barbiturates NONE DETECTED  NONE DETECTED  URINE MICROSCOPIC-ADD ON      Component Value Range   Squamous Epithelial / LPF FEW (*) RARE   WBC, UA 0-2  <3 WBC/hpf   RBC / HPF TOO NUMEROUS TO COUNT  <3  RBC/hpf   Bacteria, UA FEW (*) RARE   Labs Reviewed  CBC WITH DIFFERENTIAL - Abnormal; Notable for the following:    RDW 17.5 (*)     All other components within normal limits  COMPREHENSIVE METABOLIC PANEL - Abnormal; Notable for the following:    Glucose, Bld 118 (*)     Total Bilirubin 0.2 (*)     All other components within normal limits  URINALYSIS, ROUTINE W REFLEX MICROSCOPIC - Abnormal; Notable for the following:    Hgb urine dipstick LARGE (*)     Leukocytes, UA TRACE (*)     All other components within normal limits  URINE MICROSCOPIC-ADD ON - Abnormal; Notable for the following:    Squamous Epithelial / LPF FEW (*)     Bacteria, UA FEW (*)     All other components within normal limits  LIPASE, BLOOD  PREGNANCY, URINE  URINE RAPID DRUG SCREEN (HOSP PERFORMED)   Dg Abd Acute W/chest  11/01/2012  *RADIOLOGY REPORT*  Clinical Data: Chest and upper abdominal pain, vomiting  ACUTE ABDOMEN SERIES (ABDOMEN 2 VIEW & CHEST 1 VIEW)  Comparison: None.  Findings: Lungs are clear. No pleural effusion or pneumothorax.  Cardiomediastinal silhouette is within normal limits.  Nonspecific bowel gas pattern without disproportionate small bowel  dilatation to suggest small bowel obstruction.  Moderate stool in the right colon.  No evidence of free air under the diaphragm on the upright view.  Visualized osseous structures are within normal limits.  IMPRESSION: No evidence of acute cardiopulmonary disease.  No evidence of small bowel obstruction or free air.  Moderate stool in the right colon.   Original Report Authenticated By: Charline Bills, M.D.      No diagnosis found.    MDM   The chart was scribed for me under my direct supervision.  I personally performed the history, physical, and medical decision making and all procedures in the evaluation of this patient.Joy Lennert, MD 11/01/12 [phone]

## 2012-11-01 NOTE — ED Notes (Signed)
Pt became tearful when asking about what type of foods she can eat. States she has turned to food since not using drugs and doesn't want to go back to the drugs. Nurse spoke with patient about bland diet and spoke with patient about having a support system in place. Patient states she has a very good support system and has been attending meetings as well as having social services and mental health involved with her situation. Patient states she will use her coping skills and supports to refrain from relapsing into drug use.

## 2012-11-01 NOTE — ED Notes (Signed)
Upper epigastric pain radiating into chest and back that started approx 5 min pta.  Reports worse with deep breath.  Vomited x 1 pta.

## 2013-02-11 ENCOUNTER — Telehealth: Payer: Self-pay | Admitting: Obstetrics & Gynecology

## 2013-02-12 NOTE — Telephone Encounter (Signed)
Left message x2.

## 2013-02-13 NOTE — Telephone Encounter (Signed)
Left message x 3.

## 2013-04-29 ENCOUNTER — Encounter: Payer: Self-pay | Admitting: *Deleted

## 2013-04-30 ENCOUNTER — Encounter: Payer: Self-pay | Admitting: Advanced Practice Midwife

## 2013-04-30 ENCOUNTER — Ambulatory Visit (INDEPENDENT_AMBULATORY_CARE_PROVIDER_SITE_OTHER): Payer: Medicaid Other | Admitting: Advanced Practice Midwife

## 2013-04-30 VITALS — BP 120/80 | Ht 63.0 in | Wt 115.0 lb

## 2013-04-30 DIAGNOSIS — F1410 Cocaine abuse, uncomplicated: Secondary | ICD-10-CM

## 2013-04-30 DIAGNOSIS — Z3201 Encounter for pregnancy test, result positive: Secondary | ICD-10-CM

## 2013-04-30 NOTE — Progress Notes (Signed)
Joy Juarez 37 y.o. Is here to start [facility] pills.,  She has a + UPT here in the office and appears extremely upset by it.  FOB is 57 and they have been dating a month.,  Pt notified of her options regarding her + UPT.  She has only been with FOB 1.5 months.  She states she has been clean for 2 months  Review of Systems   Constitutional: Negative for fever and chills Eyes: Negative for visual disturbances Respiratory: Negative for shortness of breath, dyspnea Cardiovascular: Negative for chest pain or palpitations  Gastrointestinal: Negative for vomiting, diarrhea and constipation Genitourinary: Negative for dysuria and urgency Musculoskeletal: Negative for back pain, joint pain, myalgias  Neurological: Negative for dizziness and headaches  TVUS:  No Gest sac.  Pt is probably very early pregnant. Will confirm with Quant Hcg.  Pt given ectopic precautions.  If she decides to continue the pregnancy, she will come back in ~ 3 weeks for a U/S and New OB appt.  Had a SVD 7 months ago.

## 2013-05-01 LAB — HCG, QUANTITATIVE, PREGNANCY: hCG, Beta Chain, Quant, S: 577.4 m[IU]/mL

## 2013-05-08 ENCOUNTER — Other Ambulatory Visit: Payer: Self-pay | Admitting: Obstetrics & Gynecology

## 2013-05-08 DIAGNOSIS — O3680X0 Pregnancy with inconclusive fetal viability, not applicable or unspecified: Secondary | ICD-10-CM

## 2013-05-13 ENCOUNTER — Encounter: Payer: Self-pay | Admitting: Advanced Practice Midwife

## 2013-05-13 ENCOUNTER — Ambulatory Visit (INDEPENDENT_AMBULATORY_CARE_PROVIDER_SITE_OTHER): Payer: Medicaid Other | Admitting: Advanced Practice Midwife

## 2013-05-13 VITALS — BP 110/70 | Ht 63.0 in | Wt 112.0 lb

## 2013-05-13 DIAGNOSIS — O200 Threatened abortion: Secondary | ICD-10-CM

## 2013-05-13 DIAGNOSIS — Z1389 Encounter for screening for other disorder: Secondary | ICD-10-CM

## 2013-05-13 DIAGNOSIS — Z331 Pregnant state, incidental: Secondary | ICD-10-CM

## 2013-05-13 LAB — POCT URINALYSIS DIPSTICK
Glucose, UA: NEGATIVE
Ketones, UA: NEGATIVE
Nitrite, UA: NEGATIVE

## 2013-05-13 NOTE — Progress Notes (Signed)
[name] 38 y.o.  Review of Systems   Constitutional: Negative for fever and chills Eyes: Negative for visual disturbances Respiratory: Negative for shortness of breath, dyspnea Cardiovascular: Negative for chest pain or palpitations  Gastrointestinal: Negative for vomiting, diarrhea and constipation Genitourinary: Negative for dysuria and urgency Musculoskeletal: Negative for back pain, joint pain, myalgias  Neurological: Negative for dizziness and headaches   Was having intercourse last night and "had a big gush of blood".  No bleeding since.  LMP unreliable.  QHCG 6/11 was 577.  U/S shows IUP with yolk sac, fetal pole with FCA ~ 100 bpm.  No blood whatsoever.    A/P:  Early IUP, probable bleeding 2/2 intercourse.  F/U Thursday as scheduled for official u/s.

## 2013-05-15 ENCOUNTER — Other Ambulatory Visit: Payer: Medicaid Other

## 2013-05-21 ENCOUNTER — Other Ambulatory Visit: Payer: Self-pay | Admitting: Obstetrics & Gynecology

## 2013-05-21 DIAGNOSIS — O3680X1 Pregnancy with inconclusive fetal viability, fetus 1: Secondary | ICD-10-CM

## 2013-05-22 ENCOUNTER — Other Ambulatory Visit: Payer: Self-pay | Admitting: Obstetrics & Gynecology

## 2013-05-22 ENCOUNTER — Ambulatory Visit (INDEPENDENT_AMBULATORY_CARE_PROVIDER_SITE_OTHER): Payer: Medicaid Other

## 2013-05-22 DIAGNOSIS — O3680X1 Pregnancy with inconclusive fetal viability, fetus 1: Secondary | ICD-10-CM

## 2013-05-22 DIAGNOSIS — O3680X0 Pregnancy with inconclusive fetal viability, not applicable or unspecified: Secondary | ICD-10-CM

## 2013-05-22 NOTE — Progress Notes (Signed)
U/S- single IUP with +FCA noted, CRL c/w 7+2wks, EDD 01/06/2014, cx long and closed, bilateral adnexa WNL, C.L. Noted on RT

## 2013-06-02 ENCOUNTER — Encounter: Payer: Medicaid Other | Admitting: Women's Health

## 2013-06-02 ENCOUNTER — Encounter: Payer: Self-pay | Admitting: *Deleted

## 2013-06-10 ENCOUNTER — Encounter: Payer: Medicaid Other | Admitting: Women's Health

## 2013-06-10 ENCOUNTER — Encounter: Payer: Self-pay | Admitting: *Deleted

## 2013-06-15 ENCOUNTER — Emergency Department (HOSPITAL_COMMUNITY)
Admission: EM | Admit: 2013-06-15 | Discharge: 2013-06-15 | Disposition: A | Discharge status: Home / Self Care | Payer: Medicaid Other | Attending: Emergency Medicine | Admitting: Emergency Medicine

## 2013-06-15 DIAGNOSIS — R109 Unspecified abdominal pain: Secondary | ICD-10-CM | POA: Insufficient documentation

## 2013-06-15 DIAGNOSIS — N12 Tubulo-interstitial nephritis, not specified as acute or chronic: Secondary | ICD-10-CM | POA: Insufficient documentation

## 2013-06-15 DIAGNOSIS — Z880 Allergy status to penicillin: Secondary | ICD-10-CM | POA: Insufficient documentation

## 2013-06-15 DIAGNOSIS — R509 Fever, unspecified: Secondary | ICD-10-CM | POA: Insufficient documentation

## 2013-06-15 DIAGNOSIS — F17200 Nicotine dependence, unspecified, uncomplicated: Secondary | ICD-10-CM | POA: Insufficient documentation

## 2013-06-15 DIAGNOSIS — Z8619 Personal history of other infectious and parasitic diseases: Secondary | ICD-10-CM | POA: Insufficient documentation

## 2013-06-15 DIAGNOSIS — O9989 Other specified diseases and conditions complicating pregnancy, childbirth and the puerperium: Secondary | ICD-10-CM | POA: Insufficient documentation

## 2013-06-15 DIAGNOSIS — O2301 Infections of kidney in pregnancy, first trimester: Secondary | ICD-10-CM

## 2013-06-15 LAB — URINALYSIS, ROUTINE W REFLEX MICROSCOPIC
Bilirubin Urine: NEGATIVE
Protein, ur: NEGATIVE mg/dL
Specific Gravity, Urine: 1.025 (ref 1.005–1.030)
Urobilinogen, UA: 1 mg/dL (ref 0.0–1.0)
pH: 6.5 (ref 5.0–8.0)

## 2013-06-15 LAB — URINE MICROSCOPIC-ADD ON

## 2013-06-15 MED ORDER — CEFTRIAXONE SODIUM 1 G IJ SOLR
INTRAMUSCULAR | Status: AC
  Administered 2013-06-15: 1 g via INTRAMUSCULAR
  Filled 2013-06-15: qty 10

## 2013-06-15 MED ORDER — LIDOCAINE HCL (PF) 1 % IJ SOLN
INTRAMUSCULAR | Status: AC
  Administered 2013-06-15: 18:00:00
  Filled 2013-06-15: qty 5

## 2013-06-15 MED ORDER — ACETAMINOPHEN 325 MG PO TABS
650.0000 mg | ORAL_TABLET | Freq: Once | ORAL | Status: AC
  Administered 2013-06-15: 650 mg via ORAL
  Filled 2013-06-15: qty 2

## 2013-06-15 MED ORDER — CEFTRIAXONE SODIUM 1 G IJ SOLR: 1.0000 g | Freq: Once | INTRAMUSCULAR | Status: AC

## 2013-06-15 MED ORDER — SODIUM CHLORIDE 0.9 % IV BOLUS (SEPSIS): 1000.0000 mL | Freq: Once | INTRAVENOUS | Status: DC

## 2013-06-15 MED ORDER — ONDANSETRON 4 MG PO TBDP: ORAL_TABLET | ORAL | Status: DC

## 2013-06-15 MED ORDER — CEPHALEXIN 500 MG PO CAPS: 500.0000 mg | ORAL_CAPSULE | Freq: Three times a day (TID) | ORAL | Status: DC

## 2013-06-15 MED ORDER — CEFTRIAXONE SODIUM 1 G IJ SOLR
1.0000 g | Freq: Once | INTRAMUSCULAR | Status: DC
  Filled 2013-06-15: qty 10

## 2013-06-15 MED ORDER — CEFTRIAXONE SODIUM 1 G IJ SOLR: 1.0000 g | Freq: Once | INTRAMUSCULAR | Status: DC

## 2013-06-15 NOTE — ED Notes (Signed)
Pt drinking water and eating crackers
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
## 2013-06-15 NOTE — ED Provider Notes (Signed)
CSN: [id number]     Arrival date & time 06/15/13  1717 History     First MD Initiated Contact with Patient 06/15/13 1727     Chief Complaint  Patient presents with  . Flank Pain   (Consider location/radiation/quality/duration/timing/severity/associated sxs/prior Treatment) HPI Comments: 37 yo female G3P2, UTI, STD hx presents [redacted] wks pregnant and right flank pain.  This feels similar to UTI/ kidney infection hx.  2 days of right flank pain, increased frequency.  Low grade fever today.  Pt was exposed to STDs one month ago but no vaginal bleeding, discharge or lower pelvic pain.  Ache sensation.    Patient is a 37 y.o. female presenting with flank pain. The history is provided by the patient.  Flank Pain This is a recurrent problem. Pertinent negatives include no abdominal pain and no shortness of breath.    Past Medical History  Diagnosis Date  . Pregnant   . Infection 07/08/2012  . HSV-2 (herpes simplex virus 2) infection   . Hx of gonorrhea   . Hx of chlamydia infection    Past Surgical History  Procedure Laterality Date  . Ankle surgery    . Abdominal surgery     Family History  Problem Relation Age of Onset  . Diabetes Mother   . Diabetes Maternal Aunt   . Diabetes Maternal Uncle   . Diabetes Maternal Grandmother   . Diabetes Maternal Grandfather    History  Substance Use Topics  . Smoking status: Current Every Day Smoker -- 0.50 packs/day    Types: Cigarettes  . Smokeless tobacco: Never Used  . Alcohol Use: No   OB History   Grav Para Term Preterm Abortions TAB SAB Ect Mult Living   3 2 2  0 0 0 0 0 0 2     Review of Systems  Constitutional: Positive for fever. Negative for chills and appetite change.  HENT: Negative for neck pain and neck stiffness.   Eyes: Negative for visual disturbance.  Respiratory: Negative for shortness of breath.   Gastrointestinal: Negative for vomiting and abdominal pain.  Genitourinary: Positive for flank pain.  Musculoskeletal:  Negative for back pain.  Skin: Negative for wound.    Allergies  Penicillins  Home Medications  No current outpatient prescriptions on file. BP 132/90  Pulse 108  Temp(Src) 100.9 F (38.3 C) (Oral)  Resp 15  SpO2 99%  LMP 11/01/2012 Physical Exam  Nursing note and vitals reviewed. Constitutional: She is oriented to person, place, and time. She appears well-developed and well-nourished.  HENT:  Head: Normocephalic and atraumatic.  Eyes: Conjunctivae are normal. Right eye exhibits no discharge. Left eye exhibits no discharge.  Neck: Normal range of motion. Neck supple. No tracheal deviation present.  Cardiovascular: Regular rhythm.  Tachycardia present.   Pulmonary/Chest: Effort normal and breath sounds normal.  Abdominal: Soft. She exhibits no distension. There is no tenderness. There is no guarding.  Musculoskeletal: She exhibits tenderness (mild right flank). She exhibits no edema.  Neurological: She is alert and oriented to person, place, and time.  Skin: Skin is warm. No rash noted.  Psychiatric: She has a normal mood and affect.    ED Course   Procedures (including critical care time) Emergency Focused Ultrasound Exam: Limited abdomen of kidneys and bladder Indication: flank pain Focused abdominal ultrasound with kidneys imaged in transverse and longitudinal planes with bladder visualized in transverse plane. Interpretation: no hydronephrosis visualized.  No stones visualized  Images stored on the machine. EMERGENCY DEPARTMENT [place] PREGNANCY "  Study: Limited Ultrasound of the Pelvis"  INDICATIONS:Pregnancy(required) Multiple views of the uterus and pelvic cavity are obtained with a multi-frequency probe.  APPROACH:Transabdominal   PERFORMED BY: Myself  IMAGES ARCHIVED?: Yes  PREGNANCY FREE FLUID: None  PREGNANCY FINDINGS: Fetal heart activity seen  INTERPRETATION: Viable intrauterine pregnancy   FETAL HEART RATE: 176      Labs Reviewed  URINALYSIS,  ROUTINE W REFLEX MICROSCOPIC - Abnormal; Notable for the following:    Hgb urine dipstick SMALL (*)    Leukocytes, UA TRACE (*)    All other components within normal limits  URINE MICROSCOPIC-ADD ON - Abnormal; Notable for the following:    Bacteria, UA MANY (*)    All other components within normal limits  URINE CULTURE  URINE CULTURE  CBC WITH DIFFERENTIAL  COMPREHENSIVE METABOLIC PANEL   No results found. No diagnosis found.  MDM  Concern for UTI/ pyelo in early pregnancy.  Pt refuses blood work and IV fluids, pt has capacity to make decisions and realizes infection may make her very sick or affect pregnancy.  IM rocephin given and stressed close fup with pcp and Dr Emelda Fear.   Pt understands.  Discussed with Dr Emelda Fear, he will see in office if she follows up. Bedside US no hydronephrosis, normal FHR, intrauterine preg.  Tylenol and dc, well appearing on dc.    Enid Skeens, MD 06/15/13 2140

## 2013-06-15 NOTE — ED Notes (Signed)
States that she started having right flank pain 2 days ago.  States that she has a hx of kidney infections.  States that she had "dialysis" for her last infection.

## 2013-06-17 ENCOUNTER — Encounter: Payer: Medicaid Other | Admitting: Women's Health

## 2013-06-17 ENCOUNTER — Encounter: Payer: Self-pay | Admitting: *Deleted

## 2013-06-17 LAB — URINE CULTURE

## 2013-07-17 ENCOUNTER — Encounter: Payer: Self-pay | Admitting: Advanced Practice Midwife

## 2013-07-17 ENCOUNTER — Ambulatory Visit (INDEPENDENT_AMBULATORY_CARE_PROVIDER_SITE_OTHER): Payer: Medicaid Other | Admitting: Advanced Practice Midwife

## 2013-07-17 VITALS — BP 90/56 | Wt 107.6 lb

## 2013-07-17 DIAGNOSIS — F1410 Cocaine abuse, uncomplicated: Secondary | ICD-10-CM

## 2013-07-17 DIAGNOSIS — Z3490 Encounter for supervision of normal pregnancy, unspecified, unspecified trimester: Secondary | ICD-10-CM

## 2013-07-17 DIAGNOSIS — Z1389 Encounter for screening for other disorder: Secondary | ICD-10-CM

## 2013-07-17 DIAGNOSIS — Z3480 Encounter for supervision of other normal pregnancy, unspecified trimester: Secondary | ICD-10-CM

## 2013-07-17 DIAGNOSIS — Z331 Pregnant state, incidental: Secondary | ICD-10-CM

## 2013-07-17 LAB — POCT URINALYSIS DIPSTICK
Blood, UA: NEGATIVE
Glucose, UA: NEGATIVE
Nitrite, UA: NEGATIVE
Protein, UA: NEGATIVE

## 2013-07-17 NOTE — Progress Notes (Signed)
Pt rescheduled appt. JSY

## 2013-07-17 NOTE — Progress Notes (Signed)
Scheduled for a new OB appt.  Pt could not stay for labs/paperwork.  Her weight is down due to lack of appetite.  Rx for ensure BID written [facility] form filled out.

## 2013-07-30 ENCOUNTER — Encounter: Payer: Medicaid Other | Admitting: Advanced Practice Midwife

## 2013-08-05 ENCOUNTER — Encounter: Payer: Medicaid Other | Admitting: Women's Health

## 2013-08-05 ENCOUNTER — Encounter: Payer: Self-pay | Admitting: *Deleted

## 2013-08-11 ENCOUNTER — Ambulatory Visit (INDEPENDENT_AMBULATORY_CARE_PROVIDER_SITE_OTHER): Payer: Medicaid Other | Admitting: Obstetrics and Gynecology

## 2013-08-11 VITALS — BP 118/58 | Wt 107.2 lb

## 2013-08-11 DIAGNOSIS — Z331 Pregnant state, incidental: Secondary | ICD-10-CM

## 2013-08-11 DIAGNOSIS — IMO0002 Reserved for concepts with insufficient information to code with codable children: Secondary | ICD-10-CM

## 2013-08-11 DIAGNOSIS — O99891 Other specified diseases and conditions complicating pregnancy: Secondary | ICD-10-CM

## 2013-08-11 DIAGNOSIS — O9989 Other specified diseases and conditions complicating pregnancy, childbirth and the puerperium: Secondary | ICD-10-CM

## 2013-08-11 DIAGNOSIS — Z1389 Encounter for screening for other disorder: Secondary | ICD-10-CM

## 2013-08-11 NOTE — Patient Instructions (Signed)
Keep appointment with Wandra Mannan this am.\ Be Aware of [employer] a women's support  Organization.
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
## 2013-08-15 ENCOUNTER — Other Ambulatory Visit: Payer: Self-pay | Admitting: Obstetrics & Gynecology

## 2013-08-15 DIAGNOSIS — Z1389 Encounter for screening for other disorder: Secondary | ICD-10-CM

## 2013-08-21 ENCOUNTER — Encounter: Payer: Medicaid Other | Admitting: Advanced Practice Midwife

## 2013-08-21 ENCOUNTER — Ambulatory Visit (INDEPENDENT_AMBULATORY_CARE_PROVIDER_SITE_OTHER): Payer: Medicaid Other

## 2013-08-21 DIAGNOSIS — Z1389 Encounter for screening for other disorder: Secondary | ICD-10-CM

## 2013-08-21 DIAGNOSIS — O0930 Supervision of pregnancy with insufficient antenatal care, unspecified trimester: Secondary | ICD-10-CM

## 2013-08-21 DIAGNOSIS — F1911 Other psychoactive substance abuse, in remission: Secondary | ICD-10-CM

## 2013-08-21 DIAGNOSIS — O09299 Supervision of pregnancy with other poor reproductive or obstetric history, unspecified trimester: Secondary | ICD-10-CM

## 2013-08-21 DIAGNOSIS — O0932 Supervision of pregnancy with insufficient antenatal care, second trimester: Secondary | ICD-10-CM

## 2013-08-21 NOTE — Progress Notes (Signed)
U/S(20+2wks)-active fetus, meas c/w dates, fluid wnl, ant gr 0 plac, cx long and closed, bilateral adnexa wnl, female fetus, no major abnl noted

## 2013-09-03 ENCOUNTER — Encounter: Payer: Self-pay | Admitting: Obstetrics and Gynecology

## 2013-09-12 ENCOUNTER — Emergency Department (HOSPITAL_COMMUNITY)
Admission: EM | Admit: 2013-09-12 | Discharge: 2013-09-12 | Discharge status: Against Medical Advice | Payer: Medicaid Other | Attending: Emergency Medicine | Admitting: Emergency Medicine

## 2013-09-12 ENCOUNTER — Encounter (HOSPITAL_COMMUNITY): Payer: Self-pay | Admitting: Emergency Medicine

## 2013-09-12 DIAGNOSIS — O99330 Smoking (tobacco) complicating pregnancy, unspecified trimester: Secondary | ICD-10-CM | POA: Insufficient documentation

## 2013-09-12 DIAGNOSIS — IMO0002 Reserved for concepts with insufficient information to code with codable children: Secondary | ICD-10-CM | POA: Insufficient documentation

## 2013-09-12 DIAGNOSIS — O9989 Other specified diseases and conditions complicating pregnancy, childbirth and the puerperium: Secondary | ICD-10-CM | POA: Insufficient documentation
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
## 2013-09-12 NOTE — ED Notes (Signed)
Not in waiting area.  

## 2013-09-12 NOTE — ED Notes (Signed)
Patient is not in the department.

## 2013-09-15 ENCOUNTER — Encounter: Payer: Medicaid Other | Admitting: Women's Health

## 2013-09-17 ENCOUNTER — Encounter: Payer: Self-pay | Admitting: Advanced Practice Midwife

## 2013-09-17 ENCOUNTER — Encounter (INDEPENDENT_AMBULATORY_CARE_PROVIDER_SITE_OTHER): Payer: Self-pay

## 2013-09-17 ENCOUNTER — Other Ambulatory Visit (HOSPITAL_COMMUNITY)
Admission: RE | Admit: 2013-09-17 | Discharge: 2013-09-17 | Disposition: A | Discharge status: Home / Self Care | Payer: Medicaid Other | Source: Ambulatory Visit | Attending: Advanced Practice Midwife | Admitting: Advanced Practice Midwife

## 2013-09-17 ENCOUNTER — Ambulatory Visit (INDEPENDENT_AMBULATORY_CARE_PROVIDER_SITE_OTHER): Payer: Medicaid Other | Admitting: Advanced Practice Midwife

## 2013-09-17 VITALS — BP 90/58 | Wt 116.0 lb

## 2013-09-17 DIAGNOSIS — O26842 Uterine size-date discrepancy, second trimester: Secondary | ICD-10-CM

## 2013-09-17 DIAGNOSIS — Z1389 Encounter for screening for other disorder: Secondary | ICD-10-CM

## 2013-09-17 DIAGNOSIS — O98212 Gonorrhea complicating pregnancy, second trimester: Secondary | ICD-10-CM

## 2013-09-17 DIAGNOSIS — O26849 Uterine size-date discrepancy, unspecified trimester: Secondary | ICD-10-CM

## 2013-09-17 DIAGNOSIS — Z1151 Encounter for screening for human papillomavirus (HPV): Secondary | ICD-10-CM | POA: Insufficient documentation

## 2013-09-17 DIAGNOSIS — Z331 Pregnant state, incidental: Secondary | ICD-10-CM

## 2013-09-17 DIAGNOSIS — F1410 Cocaine abuse, uncomplicated: Secondary | ICD-10-CM

## 2013-09-17 DIAGNOSIS — A568 Sexually transmitted chlamydial infection of other sites: Secondary | ICD-10-CM

## 2013-09-17 DIAGNOSIS — Z01419 Encounter for gynecological examination (general) (routine) without abnormal findings: Secondary | ICD-10-CM

## 2013-09-17 DIAGNOSIS — Z3480 Encounter for supervision of other normal pregnancy, unspecified trimester: Secondary | ICD-10-CM

## 2013-09-17 DIAGNOSIS — F1920 Other psychoactive substance dependence, uncomplicated: Secondary | ICD-10-CM

## 2013-09-17 DIAGNOSIS — IMO0002 Reserved for concepts with insufficient information to code with codable children: Secondary | ICD-10-CM

## 2013-09-17 LAB — POCT URINALYSIS DIPSTICK: Blood, UA: NEGATIVE

## 2013-09-17 NOTE — Addendum Note (Signed)
Addended by: Criss Alvine on: 09/17/2013 03:40 PM   Modules accepted: Orders
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
## 2013-09-17 NOTE — Patient Instructions (Signed)
1. Before your test, do not eat or drink anything for 8-10 hours prior to your  appointment (a small amount of water is allowed and you may take any medicines you normally take). 2. When you arrive, your blood will be drawn for a 'fasting' blood sugar level.  Then you will be given a sweetened carbonated beverage to drink. You should  complete drinking this beverage within five minutes. After finishing the  beverage, you will have your blood drawn exactly 1 and 2 hours later. Having  your blood drawn on time is an important part of this test. A total of three blood  samples will be done. 3. The test takes approximately 2  hours. During the test, do not have anything to  eat or drink. Do not smoke, chew gum (not even sugarless gum) or use breath mints.  4. During the test you should remain close by and seated as much as possible and  avoid walking around. You may want to bring a book or something else to  occupy your time.  5. After your test, you may eat and drink as normal. You may want to bring a snack  to eat after the test is finished. Your provider will advise you as to the results of  this test and any follow-up if necessary  

## 2013-09-18 ENCOUNTER — Telehealth: Payer: Self-pay | Admitting: Obstetrics & Gynecology

## 2013-09-18 LAB — DRUG SCREEN, URINE, NO CONFIRMATION
Benzodiazepines.: NEGATIVE
Creatinine,U: 83.3 mg/dL
Marijuana Metabolite: NEGATIVE
Opiate Screen, Urine: NEGATIVE
Phencyclidine (PCP): NEGATIVE
Propoxyphene: NEGATIVE

## 2013-09-18 LAB — URINALYSIS, ROUTINE W REFLEX MICROSCOPIC
Hgb urine dipstick: NEGATIVE
Ketones, ur: NEGATIVE mg/dL
Leukocytes, UA: NEGATIVE
Nitrite: NEGATIVE
Protein, ur: NEGATIVE mg/dL
Urobilinogen, UA: 0.2 mg/dL (ref 0.0–1.0)
pH: 8 (ref 5.0–8.0)

## 2013-09-18 LAB — ABO AND RH: Rh Type: POSITIVE

## 2013-09-18 LAB — ANTIBODY SCREEN: Antibody Screen: NEGATIVE

## 2013-09-18 LAB — HEPATITIS B SURFACE ANTIGEN: Hepatitis B Surface Ag: NEGATIVE

## 2013-09-18 LAB — VARICELLA ZOSTER ANTIBODY, IGG: Varicella IgG: 248.5 Index — ABNORMAL HIGH (ref <135.00)

## 2013-09-19 LAB — URINE CULTURE: Organism ID, Bacteria: NO GROWTH

## 2013-09-20 LAB — GC/CHLAMYDIA PROBE AMP: GC Probe RNA: POSITIVE — AB

## 2013-09-21 DIAGNOSIS — A568 Sexually transmitted chlamydial infection of other sites: Secondary | ICD-10-CM | POA: Insufficient documentation

## 2013-09-21 DIAGNOSIS — O98212 Gonorrhea complicating pregnancy, second trimester: Secondary | ICD-10-CM | POA: Insufficient documentation

## 2013-09-21 MED ORDER — AZITHROMYCIN 500 MG PO TABS: 1000.0000 mg | ORAL_TABLET | Freq: Once | ORAL | Status: DC

## 2013-09-21 NOTE — Addendum Note (Signed)
Addended by: Jacklyn Shell on: 09/21/2013 07:41 PM   Modules accepted: Orders

## 2013-09-22 ENCOUNTER — Telehealth: Payer: Self-pay | Admitting: Adult Health

## 2013-09-22 LAB — CYSTIC FIBROSIS DIAGNOSTIC STUDY

## 2013-09-22 NOTE — Telephone Encounter (Signed)
Not at home. 

## 2013-09-24 ENCOUNTER — Other Ambulatory Visit: Payer: Self-pay | Admitting: Advanced Practice Midwife

## 2013-09-24 DIAGNOSIS — Z3490 Encounter for supervision of normal pregnancy, unspecified, unspecified trimester: Secondary | ICD-10-CM

## 2013-09-24 DIAGNOSIS — F1410 Cocaine abuse, uncomplicated: Secondary | ICD-10-CM

## 2013-09-24 NOTE — Telephone Encounter (Signed)
Joy Juarez discussed results with pt on 09/24/13. Encounter closed. JSY

## 2013-09-25 ENCOUNTER — Ambulatory Visit: Payer: Medicaid Other

## 2013-10-08 ENCOUNTER — Encounter: Payer: Self-pay | Admitting: Advanced Practice Midwife

## 2013-10-08 ENCOUNTER — Other Ambulatory Visit: Payer: Medicaid Other

## 2013-10-08 ENCOUNTER — Ambulatory Visit (INDEPENDENT_AMBULATORY_CARE_PROVIDER_SITE_OTHER): Payer: Medicaid Other

## 2013-10-08 ENCOUNTER — Ambulatory Visit (INDEPENDENT_AMBULATORY_CARE_PROVIDER_SITE_OTHER): Payer: Medicaid Other | Admitting: Advanced Practice Midwife

## 2013-10-08 ENCOUNTER — Other Ambulatory Visit: Payer: Self-pay | Admitting: Advanced Practice Midwife

## 2013-10-08 VITALS — BP 80/60 | Wt 118.0 lb

## 2013-10-08 DIAGNOSIS — O26842 Uterine size-date discrepancy, second trimester: Secondary | ICD-10-CM

## 2013-10-08 DIAGNOSIS — Z3483 Encounter for supervision of other normal pregnancy, third trimester: Secondary | ICD-10-CM

## 2013-10-08 DIAGNOSIS — F1910 Other psychoactive substance abuse, uncomplicated: Secondary | ICD-10-CM

## 2013-10-08 DIAGNOSIS — O98319 Other infections with a predominantly sexual mode of transmission complicating pregnancy, unspecified trimester: Secondary | ICD-10-CM

## 2013-10-08 DIAGNOSIS — F1920 Other psychoactive substance dependence, uncomplicated: Secondary | ICD-10-CM

## 2013-10-08 DIAGNOSIS — R768 Other specified abnormal immunological findings in serum: Secondary | ICD-10-CM

## 2013-10-08 DIAGNOSIS — O26849 Uterine size-date discrepancy, unspecified trimester: Secondary | ICD-10-CM

## 2013-10-08 DIAGNOSIS — B009 Herpesviral infection, unspecified: Secondary | ICD-10-CM

## 2013-10-08 MED ORDER — CEFTRIAXONE SODIUM 250 MG IJ SOLR: 250.0000 mg | Freq: Once | INTRAMUSCULAR | Status: DC

## 2013-10-08 NOTE — Progress Notes (Signed)
Had u/s for size < dates (see below).  Pt started her 2 hour GTT, and was sent to an exam room after her ultrasound and her first one hour blood draw.  Pt stated that she could not provide a urine specimen.  I told her I would do her exam after she voided.  Pt left the exam room, stated that she had somewhere to be and "couldn't sit still in one place for 2 hours" and left.  Both the nurses and the front office staff strongly encouraged her to stay and finish her GTT, and to receive her Rocephin for + GC, but she refused, stating that "Drenda Freeze has pissed me off and I've never had diabetes before anyway."  All of this took place while I was in another patient's room, so I did not have an opportunity to discuss this with her.  I spoke with Dr.Eure about the above described situation.  Because Joy Juarez has repeatedly missed appointments and is non compliant with recommended care, she will be sent a letter dismissing her from the practice.

## 2013-10-08 NOTE — Progress Notes (Signed)
U/S(27+1wks)-breech, active fetus, EFW 1 lb 14oz (28th%tile), fluid WNL AFI-13.3cm, anterior gr 1 placenta, unable to view cervix due to fetal position, FHR-138 bpm, female fetus "Joy Juarez"

## 2013-10-09 DIAGNOSIS — B009 Herpesviral infection, unspecified: Secondary | ICD-10-CM | POA: Insufficient documentation

## 2013-10-09 LAB — ANTIBODY SCREEN: Antibody Screen: NEGATIVE

## 2013-10-09 LAB — RPR

## 2013-10-09 LAB — CBC
HCT: 36.4 % (ref 36.0–46.0)
MCH: 30.8 pg (ref 26.0–34.0)
MCV: 90.3 fL (ref 78.0–100.0)
RBC: 4.03 MIL/uL (ref 3.87–5.11)
WBC: 7.9 10*3/uL (ref 4.0–10.5)

## 2013-10-09 LAB — GLUCOSE TOLERANCE, 2 HOURS W/ 1HR

## 2013-10-09 LAB — HSV 2 ANTIBODY, IGG: HSV 2 Glycoprotein G Ab, IgG: 7.05 IV — ABNORMAL HIGH

## 2013-10-10 LAB — GLUCOSE TOLERANCE CARPENTER 50G: Glucose, 1 Hr, gestational: 108 mg/dL (ref 70–140)

## 2013-10-13 ENCOUNTER — Encounter: Payer: Self-pay | Admitting: *Deleted

## 2013-10-13 ENCOUNTER — Other Ambulatory Visit: Payer: Medicaid Other

## 2013-11-17 ENCOUNTER — Other Ambulatory Visit: Payer: Self-pay | Admitting: Advanced Practice Midwife

## 2013-11-20 NOTE — L&D Delivery Note (Signed)
Delivery Note At 6:33 AM a viable and healthy female was delivered via Vaginal, Spontaneous Delivery.  Female infant delivered over an intact perineum. Spontaneous cry, good tone. Placed immediately on maternal abdomen. Placenta delivered intact followed immediately by significant clots.   Clotting noted on partial surface of maternal side of placenta.  3V cord.  Bleeding resolved with uterine massage and pitocin after third stage complete. No further clots observed.  Pt given cytotec for bleeding.  Mom doing well. Concern for intrauterine drug exposure of baby.  Appropriate labs ordered and NICU consulted.  Anesthesia:  none Episiotomy:  none Lacerations: none Suture Repair: none Est. Blood Loss (mL): 400  Mom to postpartum.  Baby currently to couplet care, possible NICU care.  Joy Juarez, Joy Juarez 12/29/2013, 6:55 AM   I agree with resident's note and plan of care. Joy Juarez was present for delivery, I was present for delivery of placenta. Agree with above. Joy ScaleMichael Ryan Jai Steil, MD OB Fellow 12/29/2013 8:12 AM

## 2013-11-24 ENCOUNTER — Emergency Department (HOSPITAL_COMMUNITY)
Admission: EM | Admit: 2013-11-24 | Discharge: 2013-11-24 | Discharge status: Against Medical Advice | Payer: Medicaid Other

## 2013-11-24 NOTE — ED Notes (Signed)
Patient tearful; not taking off clothes to put on gown.  Explained to patient that she needed to undress so we could put her on the fetal monitor.  Patient became agitated and put her arm back in her shirt and said she was fine and that she is leaving.  Patient walked out before triage completed.

## 2013-12-10 ENCOUNTER — Emergency Department (HOSPITAL_COMMUNITY)
Admission: EM | Admit: 2013-12-10 | Discharge: 2013-12-10 | Discharge status: Against Medical Advice | Payer: Medicaid Other | Attending: Emergency Medicine | Admitting: Emergency Medicine

## 2013-12-10 ENCOUNTER — Encounter (HOSPITAL_COMMUNITY): Payer: Self-pay | Admitting: Emergency Medicine

## 2013-12-10 DIAGNOSIS — Z5900 Homelessness unspecified: Secondary | ICD-10-CM | POA: Insufficient documentation

## 2013-12-10 DIAGNOSIS — O99330 Smoking (tobacco) complicating pregnancy, unspecified trimester: Secondary | ICD-10-CM | POA: Insufficient documentation

## 2013-12-10 DIAGNOSIS — R45851 Suicidal ideations: Secondary | ICD-10-CM | POA: Insufficient documentation

## 2013-12-10 DIAGNOSIS — O99340 Other mental disorders complicating pregnancy, unspecified trimester: Secondary | ICD-10-CM | POA: Insufficient documentation
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
## 2013-12-10 NOTE — ED Notes (Signed)
Registration clerk went into room and told pt she was going to get her registered. Pt stated, "No, I am leaving" Pt was dressed in clothes. Registration clerk told pt, "Here, talk to your nurse." Pt stated, "No, I don't like her, and I don't like all of these men out here." (Nurse and security guard were at the desk. Pt would not stop to talk to me or to sign out.

## 2013-12-29 ENCOUNTER — Encounter (HOSPITAL_COMMUNITY): Payer: Self-pay | Admitting: *Deleted

## 2013-12-29 ENCOUNTER — Inpatient Hospital Stay (HOSPITAL_COMMUNITY)
Admission: AD | Admit: 2013-12-29 | Discharge: 2013-12-31 | DRG: 774 | Disposition: A | Discharge status: Home / Self Care | Payer: Medicaid Other | Source: Ambulatory Visit | Attending: Obstetrics & Gynecology | Admitting: Obstetrics & Gynecology

## 2013-12-29 DIAGNOSIS — IMO0001 Reserved for inherently not codable concepts without codable children: Secondary | ICD-10-CM

## 2013-12-29 DIAGNOSIS — A6000 Herpesviral infection of urogenital system, unspecified: Secondary | ICD-10-CM | POA: Diagnosis present

## 2013-12-29 DIAGNOSIS — Z5900 Homelessness unspecified: Secondary | ICD-10-CM

## 2013-12-29 DIAGNOSIS — O99344 Other mental disorders complicating childbirth: Secondary | ICD-10-CM

## 2013-12-29 DIAGNOSIS — O98519 Other viral diseases complicating pregnancy, unspecified trimester: Principal | ICD-10-CM | POA: Diagnosis present

## 2013-12-29 DIAGNOSIS — O99334 Smoking (tobacco) complicating childbirth: Secondary | ICD-10-CM | POA: Diagnosis present

## 2013-12-29 DIAGNOSIS — F1410 Cocaine abuse, uncomplicated: Secondary | ICD-10-CM | POA: Diagnosis present

## 2013-12-29 DIAGNOSIS — O09529 Supervision of elderly multigravida, unspecified trimester: Secondary | ICD-10-CM | POA: Diagnosis present

## 2013-12-29 LAB — RPR: RPR Ser Ql: NONREACTIVE

## 2013-12-29 LAB — TYPE AND SCREEN
ABO/RH(D): A POS
ANTIBODY SCREEN: NEGATIVE

## 2013-12-29 LAB — ABO/RH: ABO/RH(D): A POS

## 2013-12-29 LAB — RAPID URINE DRUG SCREEN, HOSP PERFORMED
Amphetamines: NOT DETECTED
BENZODIAZEPINES: NOT DETECTED
Barbiturates: NOT DETECTED
Cocaine: POSITIVE — AB
Opiates: NOT DETECTED
Tetrahydrocannabinol: POSITIVE — AB

## 2013-12-29 LAB — GROUP B STREP BY PCR: Group B strep by PCR: NEGATIVE

## 2013-12-29 LAB — CBC
HEMATOCRIT: 33.6 % — AB (ref 36.0–46.0)
Hemoglobin: 11.7 g/dL — ABNORMAL LOW (ref 12.0–15.0)
MCH: 30.8 pg (ref 26.0–34.0)
MCHC: 34.8 g/dL (ref 30.0–36.0)
MCV: 88.4 fL (ref 78.0–100.0)
Platelets: 164 10*3/uL (ref 150–400)
RBC: 3.8 MIL/uL — AB (ref 3.87–5.11)
RDW: 13.5 % (ref 11.5–15.5)
WBC: 14 10*3/uL — AB (ref 4.0–10.5)

## 2013-12-29 LAB — RAPID HIV SCREEN (WH-MAU): Rapid HIV Screen: NONREACTIVE

## 2013-12-29 LAB — OB RESULTS CONSOLE GBS: GBS: NEGATIVE

## 2013-12-29 MED ORDER — ACETAMINOPHEN 325 MG PO TABS: 650.0000 mg | ORAL_TABLET | ORAL | Status: DC | PRN

## 2013-12-29 MED ORDER — LIDOCAINE HCL (PF) 1 % IJ SOLN: 30.0000 mL | INTRAMUSCULAR | Status: DC | PRN

## 2013-12-29 MED ORDER — ONDANSETRON HCL 4 MG/2ML IJ SOLN: 4.0000 mg | Freq: Four times a day (QID) | INTRAMUSCULAR | Status: DC | PRN

## 2013-12-29 MED ORDER — ONDANSETRON HCL 4 MG PO TABS: 4.0000 mg | ORAL_TABLET | ORAL | Status: DC | PRN

## 2013-12-29 MED ORDER — TETANUS-DIPHTH-ACELL PERTUSSIS 5-2.5-18.5 LF-MCG/0.5 IM SUSP: 0.5000 mL | Freq: Once | INTRAMUSCULAR | Status: DC

## 2013-12-29 MED ORDER — IBUPROFEN 600 MG PO TABS
600.0000 mg | ORAL_TABLET | Freq: Four times a day (QID) | ORAL | Status: DC
  Administered 2013-12-29 – 2013-12-31 (×8): 600 mg via ORAL
  Filled 2013-12-29 (×7): qty 1

## 2013-12-29 MED ORDER — OXYTOCIN 40 UNITS IN LACTATED RINGERS INFUSION - SIMPLE MED: 62.5000 mL/h | INTRAVENOUS | Status: DC

## 2013-12-29 MED ORDER — OXYCODONE-ACETAMINOPHEN 5-325 MG PO TABS: 1.0000 | ORAL_TABLET | ORAL | Status: DC | PRN

## 2013-12-29 MED ORDER — DIPHENHYDRAMINE HCL 25 MG PO CAPS: 25.0000 mg | ORAL_CAPSULE | Freq: Four times a day (QID) | ORAL | Status: DC | PRN

## 2013-12-29 MED ORDER — AZITHROMYCIN 1 G PO PACK
1.0000 g | PACK | Freq: Once | ORAL | Status: AC
  Administered 2013-12-29: 1 g via ORAL
  Filled 2013-12-29: qty 1

## 2013-12-29 MED ORDER — BENZOCAINE-MENTHOL 20-0.5 % EX AERO
1.0000 "application " | INHALATION_SPRAY | CUTANEOUS | Status: DC | PRN
  Administered 2013-12-29: 1 via TOPICAL
  Filled 2013-12-29 (×2): qty 56

## 2013-12-29 MED ORDER — ZOLPIDEM TARTRATE 5 MG PO TABS: 5.0000 mg | ORAL_TABLET | Freq: Every evening | ORAL | Status: DC | PRN

## 2013-12-29 MED ORDER — WITCH HAZEL-GLYCERIN EX PADS: 1.0000 "application " | MEDICATED_PAD | CUTANEOUS | Status: DC | PRN

## 2013-12-29 MED ORDER — CITRIC ACID-SODIUM CITRATE 334-500 MG/5ML PO SOLN: 30.0000 mL | ORAL | Status: DC | PRN

## 2013-12-29 MED ORDER — IBUPROFEN 600 MG PO TABS: 600.0000 mg | ORAL_TABLET | Freq: Four times a day (QID) | ORAL | Status: DC | PRN

## 2013-12-29 MED ORDER — SENNOSIDES-DOCUSATE SODIUM 8.6-50 MG PO TABS
2.0000 | ORAL_TABLET | ORAL | Status: DC
  Administered 2013-12-30 – 2013-12-31 (×2): 2 via ORAL
  Filled 2013-12-29 (×2): qty 2

## 2013-12-29 MED ORDER — OXYTOCIN 40 UNITS IN LACTATED RINGERS INFUSION - SIMPLE MED
INTRAVENOUS | Status: AC
  Filled 2013-12-29: qty 1000

## 2013-12-29 MED ORDER — FLEET ENEMA 7-19 GM/118ML RE ENEM: 1.0000 | ENEMA | RECTAL | Status: DC | PRN

## 2013-12-29 MED ORDER — OXYTOCIN BOLUS FROM INFUSION
500.0000 mL | INTRAVENOUS | Status: DC
  Administered 2013-12-29: 500 mL via INTRAVENOUS

## 2013-12-29 MED ORDER — OXYCODONE-ACETAMINOPHEN 5-325 MG PO TABS
1.0000 | ORAL_TABLET | ORAL | Status: DC | PRN
  Administered 2013-12-29 (×3): 1 via ORAL
  Filled 2013-12-29 (×2): qty 1

## 2013-12-29 MED ORDER — LIDOCAINE HCL (PF) 1 % IJ SOLN
INTRAMUSCULAR | Status: AC
  Filled 2013-12-29: qty 30

## 2013-12-29 MED ORDER — LANOLIN HYDROUS EX OINT: TOPICAL_OINTMENT | CUTANEOUS | Status: DC | PRN

## 2013-12-29 MED ORDER — FENTANYL CITRATE 0.05 MG/ML IJ SOLN
100.0000 ug | INTRAMUSCULAR | Status: DC | PRN
  Administered 2013-12-29 (×2): 50 ug via INTRAVENOUS
  Filled 2013-12-29 (×3): qty 2

## 2013-12-29 MED ORDER — ONDANSETRON HCL 4 MG/2ML IJ SOLN
4.0000 mg | INTRAMUSCULAR | Status: DC | PRN
Start: 2013-12-29 — End: 2013-12-31

## 2013-12-29 MED ORDER — DIBUCAINE 1 % RE OINT
1.0000 "application " | TOPICAL_OINTMENT | RECTAL | Status: DC | PRN
  Filled 2013-12-29: qty 28

## 2013-12-29 MED ORDER — LACTATED RINGERS IV SOLN: INTRAVENOUS | Status: DC

## 2013-12-29 MED ORDER — LACTATED RINGERS IV SOLN
INTRAVENOUS | Status: DC
  Administered 2013-12-29: 05:00:00 via INTRAVENOUS

## 2013-12-29 MED ORDER — CITRIC ACID-SODIUM CITRATE 334-500 MG/5ML PO SOLN
30.0000 mL | ORAL | Status: DC | PRN
Start: 2013-12-29 — End: 2013-12-29

## 2013-12-29 MED ORDER — SIMETHICONE 80 MG PO CHEW
80.0000 mg | CHEWABLE_TABLET | ORAL | Status: DC | PRN
Start: 2013-12-29 — End: 2013-12-31

## 2013-12-29 MED ORDER — PRENATAL MULTIVITAMIN CH
1.0000 | ORAL_TABLET | Freq: Every day | ORAL | Status: DC
  Administered 2013-12-29 – 2013-12-30 (×2): 1 via ORAL
  Filled 2013-12-29 (×2): qty 1

## 2013-12-29 MED ORDER — MISOPROSTOL 200 MCG PO TABS
ORAL_TABLET | ORAL | Status: AC
  Administered 2013-12-29: 800 ug
  Filled 2013-12-29: qty 4

## 2013-12-29 MED ORDER — CEFTRIAXONE SODIUM 250 MG IJ SOLR
250.0000 mg | Freq: Once | INTRAMUSCULAR | Status: AC
  Administered 2013-12-29: 250 mg via INTRAMUSCULAR
  Filled 2013-12-29: qty 250

## 2013-12-29 MED ORDER — LACTATED RINGERS IV SOLN: 500.0000 mL | INTRAVENOUS | Status: DC | PRN

## 2013-12-29 MED ORDER — OXYCODONE-ACETAMINOPHEN 5-325 MG PO TABS
1.0000 | ORAL_TABLET | ORAL | Status: DC | PRN
  Administered 2013-12-29: 1 via ORAL
  Filled 2013-12-29: qty 1

## 2013-12-29 MED ORDER — OXYTOCIN BOLUS FROM INFUSION: 500.0000 mL | INTRAVENOUS | Status: DC

## 2013-12-29 MED ORDER — MISOPROSTOL 200 MCG PO TABS
ORAL_TABLET | ORAL | Status: AC
  Filled 2013-12-29: qty 1
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
## 2013-12-29 NOTE — MAU Note (Signed)
Contraction every 3 minutes for last hour. Denies leaking of fluid and vaginal bleeding

## 2013-12-29 NOTE — Progress Notes (Addendum)
Pt extremely agitated and non compliant.  Comfort measures provided.  Changed linens and clothes.  Regular diet given to pt.  Pt willingly took Zithromax.  Will take shot after she eats.  Pt vehemently refusing I+O cath. Sent clean catch. Dr Despina HiddenEure notified.  Spoke with [place] with Social Services notified of pt status and need for consult.
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
## 2013-12-30 ENCOUNTER — Telehealth: Payer: Self-pay | Admitting: Obstetrics & Gynecology

## 2013-12-30 LAB — CBC
HCT: 33.1 % — ABNORMAL LOW (ref 36.0–46.0)
HEMOGLOBIN: 11.1 g/dL — AB (ref 12.0–15.0)
MCH: 30.2 pg (ref 26.0–34.0)
MCHC: 33.5 g/dL (ref 30.0–36.0)
MCV: 90.2 fL (ref 78.0–100.0)
Platelets: 167 10*3/uL (ref 150–400)
RBC: 3.67 MIL/uL — ABNORMAL LOW (ref 3.87–5.11)
RDW: 13.8 % (ref 11.5–15.5)
WBC: 10.7 10*3/uL — AB (ref 4.0–10.5)

## 2013-12-30 NOTE — Progress Notes (Signed)
Patient ID: Jeri ModenaWendi J Herendeen, female   DOB: 10/15/1976, 38 y.o.   MRN: [id number] Post Partum Day 1 Subjective: no complaints, up ad lib, voiding and tolerating PO No flatus or BM, +bowel sounds Desires BTL for contraception No plans for breast feeding or circumcision Mother states infant is going to be placed in the foster care system  Objective: Blood pressure 110/70, pulse 53, temperature 98 F (36.7 C), temperature source Oral, resp. rate 18, height 5\' 4"  (1.626 m), weight 57.607 kg (127 lb), last menstrual period 11/01/2012, unknown if currently breastfeeding.  Physical Exam:  General: alert, cooperative and no distress Lochia: appropriate Uterine Fundus: firm DVT Evaluation: No evidence of DVT seen on physical exam. Negative Homan's sign.   Recent Labs  12/29/13 0505 12/30/13 0600  HGB 11.7* 11.1*  HCT 33.6* 33.1*    Assessment/Plan: Plan for discharge tomorrow   LOS: 1 day   Muazu, Aisha 12/30/2013, 8:33 AM   I spoke with and examined patient and agree with medical student's note and plan of care.  Tawana ScaleMichael Ryan Cornell Bourbon, MD OB Fellow 12/30/2013 8:56 AM

## 2013-12-30 NOTE — Progress Notes (Signed)
Late entry: pt did have speculum exam at time of admission to evaluate for HSV lesions. None were seen. Pt moved toward labor and delivered rapidly.  Tawana ScaleMichael Ryan Tekoa Hamor, MD OB Fellow

## 2013-12-30 NOTE — Progress Notes (Signed)
UR completed 

## 2013-12-30 NOTE — Lactation Note (Signed)
This note was copied from the chart of Joy Juarez Quirion. Lactation Consultation Note  Patient Name: Joy Juarez Stephanie UJWJX'BToday's Date: 12/30/2013 Reason for consult: Other (Comment) (charting for exclusion)   Maternal Data Reason for exclusion: Mother's choice to formula feed on admision  Feeding Feeding Type: Breast Fed  LATCH Score/Interventions                      Lactation Tools Discussed/Used     Consult Status Consult Status: Complete    Lynda RainwaterBryant, Gabrian Hoque Parmly 12/30/2013, 4:41 PM

## 2013-12-31 MED ORDER — IBUPROFEN 600 MG PO TABS: 600.0000 mg | ORAL_TABLET | Freq: Four times a day (QID) | ORAL | Status: DC

## 2013-12-31 MED ORDER — POLYETHYLENE GLYCOL 3350 17 G PO PACK: 34.0000 g | PACK | Freq: Once | ORAL | Status: DC

## 2013-12-31 MED ORDER — POLYETHYLENE GLYCOL 3350 17 G PO PACK
34.0000 g | PACK | Freq: Once | ORAL | Status: DC
  Filled 2013-12-31: qty 2

## 2013-12-31 MED ORDER — SENNOSIDES-DOCUSATE SODIUM 8.6-50 MG PO TABS: 2.0000 | ORAL_TABLET | Freq: Every day | ORAL | Status: DC
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
## 2013-12-31 NOTE — Progress Notes (Signed)
CSW spoke with pt briefly this morning as she expressed a desired to leave the hospital. She fully aware that infant will not discharge with her & the plan is for foster care. CSW spoke with CPS worker [place] Houchins to inform him of MOBs desire to leave at this time. Legal paper work will be served to pt in the community. Once a foster home is identified Jordan will come pick up this baby this afternoon. CSW provided MOB with a list of substance abuse treatment facilities & strongly encouraged her to follow up once discharged.      

## 2013-12-31 NOTE — Discharge Instructions (Signed)

## 2014-01-06 NOTE — Discharge Summary (Signed)
Pt seen and examined with Resident. Agree with above.

## 2014-01-12 ENCOUNTER — Ambulatory Visit: Payer: Medicaid Other | Admitting: Obstetrics & Gynecology

## 2014-01-28 ENCOUNTER — Encounter: Payer: Self-pay | Admitting: *Deleted

## 2014-02-12 ENCOUNTER — Emergency Department (HOSPITAL_COMMUNITY)
Admission: EM | Admit: 2014-02-12 | Discharge: 2014-02-12 | Disposition: A | Discharge status: Home / Self Care | Payer: MEDICAID | Attending: Emergency Medicine | Admitting: Emergency Medicine

## 2014-02-12 ENCOUNTER — Encounter (HOSPITAL_COMMUNITY): Payer: Self-pay | Admitting: Emergency Medicine

## 2014-02-12 DIAGNOSIS — Z880 Allergy status to penicillin: Secondary | ICD-10-CM | POA: Insufficient documentation

## 2014-02-12 DIAGNOSIS — F17200 Nicotine dependence, unspecified, uncomplicated: Secondary | ICD-10-CM | POA: Insufficient documentation

## 2014-02-12 DIAGNOSIS — F1410 Cocaine abuse, uncomplicated: Secondary | ICD-10-CM | POA: Insufficient documentation

## 2014-02-12 DIAGNOSIS — Z8619 Personal history of other infectious and parasitic diseases: Secondary | ICD-10-CM | POA: Insufficient documentation

## 2014-02-12 NOTE — ED Notes (Signed)
Pt refuses to change in to paper scrubs

## 2014-02-12 NOTE — ED Notes (Signed)
Dr Sheldon at bedside  

## 2014-02-12 NOTE — ED Provider Notes (Signed)
CSN: [id number]     Arrival date & time 02/12/14  1444 History   First MD Initiated Contact with Patient 02/12/14 1618     Chief Complaint  Patient presents with  . Drug Problem     (Consider location/radiation/quality/duration/timing/severity/associated sxs/prior Treatment) Patient is a 38 y.o. female presenting with drug problem.  Drug Problem   Pt with history of cocaine abuse recently gave birth but had child taken away by CPS due to drug abuse. She presents today requesting cocaine detox.She states she [facility] by DSS and [facility] here 'for three days of detox and [facility]'. Pt denies any EtOH use. She is not suicidal or homicidal. No somatic complaints.  Past Medical History  Diagnosis Date  . Pregnant   . Infection 07/08/2012  . HSV-2 (herpes simplex virus 2) infection   . Hx of gonorrhea   . Hx of chlamydia infection    Past Surgical History  Procedure Laterality Date  . Ankle surgery    . Abdominal surgery      staph infection   Family History  Problem Relation Age of Onset  . Diabetes Mother   . Diabetes Maternal Aunt   . Diabetes Maternal Uncle   . Diabetes Maternal Grandmother   . Diabetes Maternal Grandfather    History  Substance Use Topics  . Smoking status: Current Every Day Smoker -- 3.00 packs/day    Types: Cigarettes  . Smokeless tobacco: Never Used  . Alcohol Use: No   OB History   Grav Para Term Preterm Abortions TAB SAB Ect Mult Living   3 3 3  0 0 0 0 0 0 3     Review of Systems  All other systems reviewed and are negative except as noted in HPI.    Allergies  Penicillins  Home Medications  No current outpatient prescriptions on file. BP 94/69  Pulse 81  Temp(Src) 98.2 F (36.8 C) (Oral)  Resp 18  Wt 105 lb 1.6 oz (47.673 kg)  SpO2 97% Physical Exam  Constitutional: She is oriented to person, place, and time. She appears well-developed and well-nourished.  HENT:   Head: Normocephalic and atraumatic.  Neck: Neck supple.  Pulmonary/Chest: Effort normal.  Neurological: She is alert and oriented to person, place, and time. No cranial nerve deficit.  Psychiatric: She has a normal mood and affect. Her behavior is normal.    ED Course  Procedures (including critical care time) Labs Review Labs Reviewed - No data to display Imaging Review No results found.   EKG Interpretation None      MDM   Final diagnoses:  Cocaine abuse    Explained to patient that there is no detox available for cocaine and that there is no detox of any kind done in the hospital or long term treatment at [facility]. I explained the availablilty of [facility] etc but that there was no further ED evaluation or workup needed. She began upset and cursing at me. She stated 'what if I say I'm going to kill myself? Then you'd have to keep me?' but then admitted that would be a lie and again denied SI. Offered to have social work talk to her and/or give her information on outpatient resources. She initially asked for social work consult, but then changed her mind and asking for discharge with outpatient resources.     Charles B. Bernette MayersSheldon, MD 02/12/14 [phone]

## 2014-02-12 NOTE — ED Notes (Signed)
Pt requesting detox from cocaine, last used yesterday. Denies any SI or HI. Calm and cooperative at triage, no distress noted.

## 2014-02-12 NOTE — Discharge Instructions (Signed)
Cocaine Abuse and Chemical Dependency °WHEN IS DRUG USE A PROBLEM? °Anytime drug use is interfering with normal living activities it has become abuse. This includes problems with family and friends. Psychological dependence has developed when your mind tells you that the drug is needed. This is usually followed by physical dependence which has developed when continuing increases of drug are required to get the same feeling or "high". This is known as addiction or chemical dependency. A person's risk is much higher if there is a history of chemical dependency in the family. °SIGNS OF CHEMICAL DEPENDENCY: °· Been told by friends or family that drugs have become a problem. °· Fighting when using drugs. °· Having blackouts (not remembering what you do while using). °· Feel sick from using drugs but continue using. °· Lie about use or amounts of drugs (chemicals) used. °· Need chemicals to get you going. °· Suffer in work performance or school because of drug use. °· Get sick from use of drugs but continue to use anyway. °· Need drugs to relate to people or feel comfortable in social situations. °· Use drugs to forget problems. °Yes answered to any of the above signs of chemical dependency indicates there are problems. The longer the use of drugs continues, the greater the problems will become. °If there is a family history of drug or alcohol use it is best not to experiment with these drugs. Experimentation leads to tolerance and needing to use more of the drug to get the same feeling. This is followed by addiction where drugs become the most important part of life. It becomes more important to take drugs than participate in the other usual activities of life including relating to friends and family. Addiction is followed by dependency where drugs are now needed not just to get high but to feel normal. °Addiction cannot be cured but it can be stopped. This often requires outside help and the care of professionals.  Treatment centers are listed in the yellow pages under: Cocaine, Narcotics, and Alcoholics Anonymous. Most hospitals and clinics can refer you to a specialized care center. °WHAT IS COCAINE? °Cocaine is a strong nervous system stimulant which speeds up the body and gives the user the feeling that they have increased energy, loss of appetite and feelings of great pleasure. This "high" which begins within several minutes and lasts for less than an hour is followed by a "crash". The crash and depressed feelings that come with it cause a craving for the drug to regain the high. °HOW IS COCANINE USED? °Cocaine is snorted, injected, and smoked as free- base or crack. Because smoking the drug produces a greater high it is also associated with a greater low. It is therefore more rapidly addicting. °WHAT ARE THE EFFECTS OF COCAINE? °It is an anesthetic (pain killer) and a stimulant (it causes a high which gives a false feeling of well being). It increases heart and breathing rates with increases in body temperature and blood pressure. It removes appetite. It causes seizures (convulsions) along with nausea (feeling sick to your stomach), vomiting and stomach pain. This dangerous combination can lead to death. Trying to keep the high feeling leads to greater and greater drug use and this leads to addiction. °Addiction can only be helped by stopping use of all chemicals. This is hard but may save your life. If the addiction is continued, the only possible outcome is loss of self respect and self esteem, violence, death, and eventually prison if the addict is fortunate   enough to be caught and able to receive help prior to this last life ending event. OTHER HEALTH RISKS OF COCAINE AND ALL DRUG USE ARE:  The increased possibility of getting AIDS or hepatitis (liver inflammation).  Having a baby born which is addicted to cocaine and must go through painful withdrawal including shaking, jerking, and crying in pain. Many of the  babies die. Other babies go through life with lifelong disabilities and learning problems. HOW TO STAY DRUG FREE ONCE YOU HAVE QUIT USING:  Develop healthy activities and form friends who do not use drugs.  Stay away from the drug scene.  Tell the pusher or former friend you have other better things to do.  Have ready excuses available about why you cannot use. For more help or information contact your local physician, clinic, hospital or dial 1-800-cocaine [phone]([phone]). Document Released: 11/03/2000 Document Revised: 01/29/2012 Document Reviewed: 06/24/2008 [facility] Patient Information 2014 [place], [place].   Emergency Department Resource Guide 1) Find a Doctor and Pay Out of Pocket Although you won't have to find out who is covered by your insurance plan, it is a good idea to ask around and get recommendations. You will then need to call the office and see if the doctor you have chosen will accept you as a new patient and what types of options they offer for patients who are self-pay. Some doctors offer discounts or will set up payment plans for their patients who do not have insurance, but you will need to ask so you aren't surprised when you get to your appointment.  2) Contact Your Local Health Department Not all health departments have doctors that can see patients for sick visits, but many do, so it is worth a call to see if yours does. If you don't know where your local health department is, you can check in your phone book. The CDC also has a tool to help you locate your state's health department, and many state websites also have listings of all of their local health departments.  3) Find a Walk-in Clinic If your illness is not likely to be very severe or complicated, you may want to try a walk in clinic. These are popping up all over the country in pharmacies, drugstores, and shopping centers. They're usually staffed by nurse practitioners or physician assistants that have been  trained to treat common illnesses and complaints. They're usually fairly quick and inexpensive. However, if you have serious medical issues or chronic medical problems, these are probably not your best option.  No Primary Care Doctor: - Call Health Connect at  [phone] - they can help you locate a primary care doctor that  accepts your insurance, provides certain services, etc. - Physician Referral Service- [phone]  Chronic Pain Problems: Organization         Address  Phone   Notes  [facility]  [phone] Patients need to be referred by their primary care doctor.   Medication Assistance: Organization         Address  Phone   Notes  [facility] Medication [facility] [address] [place], [place] [id number] [phone] --Must be a resident of [facility] -- Must have NO insurance coverage whatsoever (no Medicaid/ Medicare, etc.) -- The pt. MUST have a primary care doctor that directs their care regularly and follows them in the community   Med[facility]ist  [phone]   [employer]  [phone]    Agencies that provide  inexpensive medical care: Organization         Address  Phone   Notes  [facility]  [phone]   [facility]    [phone]   [facility] [address] [place], [place] [address] [phone]   [facility] [address] [place], [place] [phone]   [facility]anned Parenthood    [phone]   [facility]    [phone]   [facility] and [facility]  [address], [place] Phone:  [phone], Fax:  [phone] Hours of Operation:  9 am - 6 pm, M-F.  Also accepts Medicaid/Medicare and self-pay.  [facility]  [address], [place] Phone: [phone], Fax: [phone]. Hours of Operation:  8:30 am - 5:30 pm, M-F.  Also accepts Medicaid and self-pay.   [facility] [place] [address], [place] Phone: [phone]   [facility] [address] Natasha Bence [place], [place] [phone], Ext. [phone] Mondays & Thursdays: 7-9 AM.  First 15 patients are seen on a first come, first serve basis.    Medicaid-accepting [facility] Providers:  Organization         Address  Phone   Notes  [facility] [address], [place] [phone] Also accepts self-pay patients.  [facility] [address] Laurell Josephs [place], [place]  [phone]   [facility] [address], [place] [phone]   [facility] [address], [place] [phone]   Renaye Rakers [address], [place]   [phone] Only accepts [place] Access [place] patients after they have their name applied to their card.   Self-Pay (no insurance) in [facility]:  Organization         Address  Phone   Notes  Sickle Cell Patients, [facility] [address] [place], [place] [phone]   [facility] [address] [place], [place] [phone]   [facility] [place]  [address], [place] [phone]   [facility]/Dr. Osei-Bonsu  [address] [place] or [address], [place] [phone] Phone number for both [place] and [place] locations is the same.  [facility] [address], [place] [phone]   [facility] [address] [place] or [address] [phone]   [facility] [address], [place] [phone], phone; [phone], fax Sees patients 1st and 3rd Saturday of every month.  Must not qualify for public or private insurance (i.e. Medicaid, Medicare, [place] Health Choice, Veterans' Benefits)  Household income should be no  more than 200% of the poverty level The clinic cannot treat you if you are pregnant or think you are pregnant  Sexually transmitted diseases are not treated at the clinic.   Dental Care: Organization         Address  Phone  Notes  [facility] Department of [facility] [address] [place], [place] [phone] Accepts children up to age 74 who are enrolled in [place] or [place] Health Choice; pregnant women with a Medicaid card; and children who have applied for Medicaid or [place] Health Choice, but  were declined, whose parents can pay a reduced fee at time of service.  [facility]  [address], [place] [phone] Accepts children up to age 81 who are enrolled in [place] or [place] Health Choice; pregnant women with a Medicaid card; and children who have applied for Medicaid or [place] Health Choice, but were declined, whose parents can pay a reduced fee at time of service.  [facility]  [address] [place], [place] [phone] Patients are seen by appointment only. Walk-ins are not accepted. [facility] will see patients 9 years of age and older. Monday - Tuesday (8am-5pm) Most Wednesdays (8:30-5pm) $30 per visit, cash only  [facility] Access [facility]  [address], [facility] [phone] Patients are seen by appointment only. Walk-ins are not accepted. [facility] will see patients 30 years of age and older. One Wednesday Evening (Monthly: Volunteer Based).  $30 per visit, cash only  [employer]  [phone] for adults; Children under age 67, call Graduate Pediatric Dentistry at [phone]. Children aged 41-14, please call [phone] to request a pediatric application.  Dental services are provided in all areas of dental care including fillings, crowns and bridges, complete and partial dentures, implants, gum treatment, root canals,  and extractions. Preventive care is also provided. Treatment is provided to both adults and children. Patients are selected via a lottery and there is often a waiting list.   [facility] [address] [place]  [phone] [email or web address]   [facility] [address] [place], [place] [phone], Ext. [phone] Second and Fourth Thursday of each month, opens at 6:30 AM; Clinic ends at 9 AM.  Patients are seen on a first-come first-served basis, and a limited number are seen during each clinic.   [facility]  [address] Ether Griffins [place], [place] [phone]   Eligibility Requirements You must have lived in [place], [place], or [place] counties for at least the last three months.   You cannot be eligible for state or federal sponsored [place], including [employer], [place], or [employer].   You generally cannot be eligible for healthcare insurance through your employer.    How to apply: Eligibility screenings are held every Tuesday and Wednesday afternoon from 1:00 pm until 4:00 pm. You do not need an appointment for the interview!  [facility] [address] [place], [place] [phone]   [facility] Health Department  [phone]   [facility] Health Department  [phone]   [facility] Health Department  [phone]    Behavioral Health Resources in the Community: Intensive Outpatient Programs Organization         Address  Phone  Notes  [facility] [address], [place], [place] [phone]   [facility]patient [address] [place], [place] [phone]   [facility] [address] [place], [place]  [phone]   [facility] Mental Health [address],  [place], [place] [phone] or [phone]   Substance Abuse Resources Organization         Address  Phone  Notes  Al[facility] Drug Services  [phone]    [facility]  [phone]   [facility]  [phone]   Floydene Flock  [phone]   Residential & Outpatient Substance Abuse Program  [phone]   Psychological Services  Organization         Address  Phone  Notes  [employer] [facility]  [phone]   [facility] Services  [phone]   [facility] Mental Health [address] [place] [phone] or [phone]    Mobile Crisis Teams Organization         Address  Phone  Notes  [facility], Mobile Crisis Care Unit  [phone]   [facility]  [address] [place], [place] [phone]   Doristine Locks [address] [place] [phone]    Self-Help/Support Groups Organization         Address  Phone             Notes  [facility] [place] - variety of support groups  336- I7437963 Call for more information  Narcotics Anonymous (NA), [facility] [address], [employer] [place]  2 meetings at this location   Statistician         Address  Phone  Notes  [facility] [address],    [place]  [phone]   [facility]  [address], [place] [address], [place], [place] [phone]   [facility] [address] [place], [place] [phone] Admissions: 8am-3pm M-F  [facility] [address]    [place], [place] [phone]   [facility] [address] [place], [place], [place] [phone]   [facility] [address],  [place], [place] [phone]   [facility] - Intensive Outpatient [address] Laurell Josephs [address], [place], [place] [phone]   [facility] ([facility] [address] [place].,  [place], [place] [phone] or [phone]   [facility] ([facility]) [address] [place], [place] [phone] Accepts Medicaid  [facility] [address],    [place] [phone] Substance Abuse/Addiction Treatment   [facility] Organization         Address  Phone  Notes  [facility]  [phone]   Angie Fava, PhD [address] Ervin Knack [place], [place]   [phone] or [phone]   [facility] Behavioral   [address] [place], [place] [phone]   [facility] [address] [place], [place] [phone] Insurance/Medicaid/sponsorship through [facility] [address]                                    [place], [place] [phone] Therapy/tele-psych/case  [facility] [address], [place] [phone]    Dr. Lolly Mustache  [phone]   [facility]  [employer] [facility] Dept. 1) [address]  2) [address] [place] 3)  [address], [place] [phone]  [phone]   [facility] Child Abuse Hotline 703 385 2024 or [phone] (After Hours)

## 2014-03-26 ENCOUNTER — Encounter: Payer: Medicaid Other | Admitting: Obstetrics & Gynecology

## 2014-05-11 ENCOUNTER — Encounter (HOSPITAL_COMMUNITY): Payer: Self-pay | Admitting: Emergency Medicine

## 2014-05-11 ENCOUNTER — Observation Stay (HOSPITAL_COMMUNITY)
Admission: EM | Admit: 2014-05-11 | Discharge: 2014-05-13 | Disposition: A | Discharge status: Home / Self Care | Payer: Medicaid Other | Attending: General Surgery | Admitting: General Surgery

## 2014-05-11 ENCOUNTER — Emergency Department (HOSPITAL_COMMUNITY): Payer: Medicaid Other

## 2014-05-11 DIAGNOSIS — S199XXA Unspecified injury of neck, initial encounter: Secondary | ICD-10-CM

## 2014-05-11 DIAGNOSIS — S0990XA Unspecified injury of head, initial encounter: Principal | ICD-10-CM

## 2014-05-11 DIAGNOSIS — S36113A Laceration of liver, unspecified degree, initial encounter: Secondary | ICD-10-CM

## 2014-05-11 DIAGNOSIS — S4980XA Other specified injuries of shoulder and upper arm, unspecified arm, initial encounter: Secondary | ICD-10-CM | POA: Insufficient documentation

## 2014-05-11 DIAGNOSIS — Z8619 Personal history of other infectious and parasitic diseases: Secondary | ICD-10-CM | POA: Insufficient documentation

## 2014-05-11 DIAGNOSIS — F17200 Nicotine dependence, unspecified, uncomplicated: Secondary | ICD-10-CM | POA: Insufficient documentation

## 2014-05-11 DIAGNOSIS — Z880 Allergy status to penicillin: Secondary | ICD-10-CM | POA: Insufficient documentation

## 2014-05-11 DIAGNOSIS — S0993XA Unspecified injury of face, initial encounter: Secondary | ICD-10-CM | POA: Insufficient documentation

## 2014-05-11 DIAGNOSIS — E440 Moderate protein-calorie malnutrition: Secondary | ICD-10-CM | POA: Insufficient documentation

## 2014-05-11 DIAGNOSIS — S46909A Unspecified injury of unspecified muscle, fascia and tendon at shoulder and upper arm level, unspecified arm, initial encounter: Secondary | ICD-10-CM | POA: Insufficient documentation

## 2014-05-11 MED ORDER — SODIUM CHLORIDE 0.9 % IV BOLUS (SEPSIS)
1000.0000 mL | Freq: Once | INTRAVENOUS | Status: AC
  Administered 2014-05-11: 1000 mL via INTRAVENOUS

## 2014-05-11 MED ORDER — MORPHINE SULFATE 4 MG/ML IJ SOLN
4.0000 mg | Freq: Once | INTRAMUSCULAR | Status: AC
  Administered 2014-05-11: 4 mg via INTRAVENOUS
  Filled 2014-05-11: qty 1
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
## 2014-05-12 ENCOUNTER — Encounter (HOSPITAL_COMMUNITY): Payer: Self-pay | Admitting: *Deleted

## 2014-05-12 ENCOUNTER — Other Ambulatory Visit (HOSPITAL_COMMUNITY): Payer: Medicaid Other

## 2014-05-12 ENCOUNTER — Emergency Department (HOSPITAL_COMMUNITY): Payer: Medicaid Other

## 2014-05-12 DIAGNOSIS — S36113A Laceration of liver, unspecified degree, initial encounter: Secondary | ICD-10-CM

## 2014-05-12 LAB — PROTIME-INR
INR: 1 (ref 0.00–1.49)
Prothrombin Time: 13 seconds (ref 11.6–15.2)

## 2014-05-12 LAB — COMPREHENSIVE METABOLIC PANEL
ALBUMIN: 3.8 g/dL (ref 3.5–5.2)
ALK PHOS: 63 U/L (ref 39–117)
ALT: 60 U/L — ABNORMAL HIGH (ref 0–35)
AST: 74 U/L — ABNORMAL HIGH (ref 0–37)
BUN: 15 mg/dL (ref 6–23)
CALCIUM: 8.9 mg/dL (ref 8.4–10.5)
CO2: 23 mEq/L (ref 19–32)
CREATININE: 0.9 mg/dL (ref 0.50–1.10)
Chloride: 102 mEq/L (ref 96–112)
GFR calc non Af Amer: 82 mL/min — ABNORMAL LOW (ref >90)
Glucose, Bld: 113 mg/dL — ABNORMAL HIGH (ref 70–99)
POTASSIUM: 3.9 meq/L (ref 3.7–5.3)
Sodium: 139 mEq/L (ref 137–147)
TOTAL PROTEIN: 7.2 g/dL (ref 6.0–8.3)
Total Bilirubin: 0.2 mg/dL — ABNORMAL LOW (ref 0.3–1.2)

## 2014-05-12 LAB — URINALYSIS, ROUTINE W REFLEX MICROSCOPIC
BILIRUBIN URINE: NEGATIVE
GLUCOSE, UA: NEGATIVE mg/dL
KETONES UR: NEGATIVE mg/dL
Leukocytes, UA: NEGATIVE
Nitrite: POSITIVE — AB
Protein, ur: 30 mg/dL — AB
Specific Gravity, Urine: 1.015 (ref 1.005–1.030)
Urobilinogen, UA: 0.2 mg/dL (ref 0.0–1.0)
pH: 6.5 (ref 5.0–8.0)

## 2014-05-12 LAB — HEMOGLOBIN AND HEMATOCRIT, BLOOD
HCT: 33 % — ABNORMAL LOW (ref 36.0–46.0)
HEMOGLOBIN: 10.7 g/dL — AB (ref 12.0–15.0)

## 2014-05-12 LAB — CBC WITH DIFFERENTIAL/PLATELET
BASOS PCT: 0 % (ref 0–1)
Basophils Absolute: 0 10*3/uL (ref 0.0–0.1)
EOS ABS: 0.2 10*3/uL (ref 0.0–0.7)
EOS PCT: 1 % (ref 0–5)
HCT: 37.8 % (ref 36.0–46.0)
HEMOGLOBIN: 12.6 g/dL (ref 12.0–15.0)
Lymphocytes Relative: 14 % (ref 12–46)
Lymphs Abs: 2.2 10*3/uL (ref 0.7–4.0)
MCH: 29.9 pg (ref 26.0–34.0)
MCHC: 33.3 g/dL (ref 30.0–36.0)
MCV: 89.8 fL (ref 78.0–100.0)
MONOS PCT: 6 % (ref 3–12)
Monocytes Absolute: 0.9 10*3/uL (ref 0.1–1.0)
NEUTROS PCT: 79 % — AB (ref 43–77)
Neutro Abs: 12.3 10*3/uL — ABNORMAL HIGH (ref 1.7–7.7)
Platelets: 277 10*3/uL (ref 150–400)
RBC: 4.21 MIL/uL (ref 3.87–5.11)
RDW: 14.9 % (ref 11.5–15.5)
WBC: 15.6 10*3/uL — ABNORMAL HIGH (ref 4.0–10.5)

## 2014-05-12 LAB — APTT: aPTT: 31 seconds (ref 24–37)

## 2014-05-12 LAB — URINE MICROSCOPIC-ADD ON

## 2014-05-12 LAB — TYPE AND SCREEN
ABO/RH(D): A POS
Antibody Screen: NEGATIVE

## 2014-05-12 LAB — HCG, SERUM, QUALITATIVE: Preg, Serum: NEGATIVE

## 2014-05-12 MED ORDER — IOHEXOL 300 MG/ML  SOLN
100.0000 mL | Freq: Once | INTRAMUSCULAR | Status: AC | PRN
  Administered 2014-05-12: 100 mL via INTRAVENOUS

## 2014-05-12 MED ORDER — DIPHENHYDRAMINE HCL 50 MG/ML IJ SOLN: 12.5000 mg | Freq: Four times a day (QID) | INTRAMUSCULAR | Status: DC | PRN

## 2014-05-12 MED ORDER — LACTATED RINGERS IV SOLN
INTRAVENOUS | Status: DC
  Administered 2014-05-12 – 2014-05-13 (×2): via INTRAVENOUS

## 2014-05-12 MED ORDER — ONDANSETRON HCL 4 MG/2ML IJ SOLN: 4.0000 mg | Freq: Four times a day (QID) | INTRAMUSCULAR | Status: DC | PRN

## 2014-05-12 MED ORDER — ACETAMINOPHEN 325 MG PO TABS: 650.0000 mg | ORAL_TABLET | Freq: Four times a day (QID) | ORAL | Status: DC | PRN

## 2014-05-12 MED ORDER — ACETAMINOPHEN 650 MG RE SUPP: 650.0000 mg | Freq: Four times a day (QID) | RECTAL | Status: DC | PRN

## 2014-05-12 MED ORDER — DIPHENHYDRAMINE HCL 12.5 MG/5ML PO ELIX: 12.5000 mg | ORAL_SOLUTION | Freq: Four times a day (QID) | ORAL | Status: DC | PRN

## 2014-05-12 MED ORDER — MORPHINE SULFATE 4 MG/ML IJ SOLN
4.0000 mg | Freq: Once | INTRAMUSCULAR | Status: AC
  Administered 2014-05-12: 4 mg via INTRAVENOUS
  Filled 2014-05-12: qty 1

## 2014-05-12 MED ORDER — OXYCODONE HCL 5 MG PO TABS
5.0000 mg | ORAL_TABLET | ORAL | Status: DC | PRN
  Administered 2014-05-12 – 2014-05-13 (×2): 5 mg via ORAL
  Filled 2014-05-12 (×2): qty 1

## 2014-05-12 MED ORDER — ONDANSETRON HCL 4 MG/2ML IJ SOLN: 4.0000 mg | Freq: Three times a day (TID) | INTRAMUSCULAR | Status: DC | PRN

## 2014-05-12 MED ORDER — HYDROMORPHONE HCL PF 1 MG/ML IJ SOLN
1.0000 mg | INTRAMUSCULAR | Status: DC | PRN
  Administered 2014-05-12 (×3): 1 mg via INTRAVENOUS
  Filled 2014-05-12 (×2): qty 1

## 2014-05-12 MED ORDER — NICOTINE 21 MG/24HR TD PT24
21.0000 mg | MEDICATED_PATCH | Freq: Every day | TRANSDERMAL | Status: DC
  Administered 2014-05-12 – 2014-05-13 (×2): 21 mg via TRANSDERMAL
  Filled 2014-05-12 (×2): qty 1

## 2014-05-12 MED ORDER — SODIUM CHLORIDE 0.9 % IV SOLN: INTRAVENOUS | Status: DC

## 2014-05-12 MED ORDER — HYDROMORPHONE HCL PF 1 MG/ML IJ SOLN
1.0000 mg | INTRAMUSCULAR | Status: DC | PRN
  Filled 2014-05-12 (×2): qty 1

## 2014-05-12 MED ORDER — ADULT MULTIVITAMIN W/MINERALS CH
1.0000 | ORAL_TABLET | Freq: Every day | ORAL | Status: DC
  Administered 2014-05-12 – 2014-05-13 (×2): 1 via ORAL
  Filled 2014-05-12 (×2): qty 1

## 2014-05-12 MED ORDER — LORAZEPAM 2 MG/ML IJ SOLN: 1.0000 mg | INTRAMUSCULAR | Status: DC | PRN

## 2014-05-12 MED ORDER — ENSURE COMPLETE PO LIQD
237.0000 mL | Freq: Two times a day (BID) | ORAL | Status: DC
  Administered 2014-05-12 – 2014-05-13 (×2): 237 mL via ORAL
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
## 2014-05-12 NOTE — Progress Notes (Signed)
Utilization review completed.  

## 2014-05-12 NOTE — Clinical Social Work Note (Signed)
CSW noted consult, attempted to assess patient.  Patient asleep, deferred consult until tomorrow in order to let patient rest.  Santa GeneraAnne Cunningham, LCSW Clinical Social Worker [phone])
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
## 2014-05-13 DIAGNOSIS — E440 Moderate protein-calorie malnutrition: Secondary | ICD-10-CM | POA: Insufficient documentation

## 2014-05-13 LAB — CBC
HEMATOCRIT: 34.8 % — AB (ref 36.0–46.0)
HEMOGLOBIN: 11 g/dL — AB (ref 12.0–15.0)
MCH: 28.9 pg (ref 26.0–34.0)
MCHC: 31.6 g/dL (ref 30.0–36.0)
MCV: 91.3 fL (ref 78.0–100.0)
Platelets: 239 10*3/uL (ref 150–400)
RBC: 3.81 MIL/uL — ABNORMAL LOW (ref 3.87–5.11)
RDW: 15 % (ref 11.5–15.5)
WBC: 5.9 10*3/uL (ref 4.0–10.5)

## 2014-05-13 LAB — BASIC METABOLIC PANEL
BUN: 10 mg/dL (ref 6–23)
CHLORIDE: 105 meq/L (ref 96–112)
CO2: 27 mEq/L (ref 19–32)
CREATININE: 0.7 mg/dL (ref 0.50–1.10)
Calcium: 8.5 mg/dL (ref 8.4–10.5)
GFR calc Af Amer: >90 mL/min (ref >90)
GFR calc non Af Amer: >90 mL/min (ref >90)
GLUCOSE: 100 mg/dL — AB (ref 70–99)
POTASSIUM: 4.2 meq/L (ref 3.7–5.3)
Sodium: 140 mEq/L (ref 137–147)

## 2014-05-13 MED ORDER — OXYCODONE-ACETAMINOPHEN 7.5-325 MG PO TABS: 1.0000 | ORAL_TABLET | ORAL | Status: DC | PRN

## 2014-05-13 NOTE — Progress Notes (Signed)
Patient discharged home.  IV removed - WNL.  Instructed to call MD if needed d/t swelling in abdomen, coughing up blood, or increase in pain.  Verbalizes understanding.  Instructed on pain prescription and to not drive while taking pain medication.  No questions at this time.  Stable to DC - left floor with assist of NT.
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
## 2014-05-13 NOTE — Discharge Instructions (Signed)
Liver Laceration A liver laceration is a tear or cut in the liver. The liver is the largest solid organ in the body and is involved in many important bodily functions. Sometimes, a liver laceration can be a very serious injury. It can cause a lot of bleeding. Surgery may be needed. Other times, a liver laceration may be minor. Bed rest may be all that is needed. Either way, treatment in a hospital is almost always required.  Liver lacerations are categorized in grades from 1 to 5. Low numbers identify lacerations that are less severe than those with high numbers.   Grade 1: This is a tear in the outer lining of the liver. It is less than  inch (1 cm) deep.   Grade 2: This is a tear that is about  inch to 1 inch (1 to 3 cm) deep. It is less than 4 inches (10 cm) long.   Grade 3: This is a tear that is slightly more than 1 inch (3 cm) deep.   Grades 4 and 5: These lacerations are very deep. They affect a large part of the liver.  CAUSES  A strong blow to the area around your liver (blunt trauma). Blunt trauma can tear the liver even though it does not break the skin.  An injury in which an object goes through the skin into the liver (penetrating injury). SIGNS AND SYMPTOMS The most common symptoms of liver laceration are:   A swollen and firm abdomen.   Pain in the abdomen.   Tenderness when pressing on the right side of the abdomen.  Other symptoms may include:   Bleeding from a penetrating wound.   Spitting up blood.   Bruises on the abdomen.   A fast heartbeat.   Taking quick breaths.   Feeling weak and dizzy.  DIAGNOSIS To determine if you have a liver laceration, your health care provider will perform a physical exam and ask about any injuries to the right side of the abdomen. Various tests may be ordered, such as:  Blood tests. Your blood may be tested every few hours. This will show if you are losing blood.   CT scan. This test is used to check for  laceration or bleeding.  Laparoscopy. This involves placing a small camera into the abdomen and looking directly at the surface of the liver. TREATMENT Treatment for a liver laceration will vary depending on how deep the cut is and on the amount of bleeding. Treatment options include:  Bed rest. The person is watched closely. Tests are done very often.   Blood transfusions. Blood is given from someone else. This replaces blood lost from the injury. A transfusion may need to be done several times.   Surgery. A procedure may be needed to open the abdomen. Then, special material might be packed around the laceration to help it heal, or the laceration might be repaired. HOME CARE INSTRUCTIONS  Only take over-the-counter or prescription medicines as directed by your health care provider. Take all prescribed medicines exactly as directed. Do not take any other medicines without first asking your health care provider.  Rest and limit your activity as directed by your health care provider. It may be several months before you can go back to your old routine. Do not participate in activities that involve physical contact or require extra energy until your health care provider approves.  Keep all follow-up appointments with your health care provider. You may need more blood tests or another CT  scan to make sure your injury is healing. SEEK MEDICAL CARE IF:  Your abdominal pain does not go away.   You feel more weak and tired than usual.  SEEK IMMEDIATE MEDICAL CARE IF:  Your abdominal pain gets worse.   You have a cut or incision on your skin that becomes red, swells, or leaks any fluid.   You feel dizzy or very weak.   You have trouble breathing.   You have a fever.  MAKE SURE YOU:  Understand these instructions.  Will watch your condition.  Will get help right away if you are not doing well or get worse. Document Released: 12/09/2010 Document Revised: 07/09/2013 Document  Reviewed: 04/28/2013 [facility] Patient Information 2015 [employer] [place] [place]. This information is not intended to replace advice given to you by your health care provider. Make sure you discuss any questions you have with your health care provider.
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
## 2014-09-21 ENCOUNTER — Encounter (HOSPITAL_COMMUNITY): Payer: Self-pay | Admitting: *Deleted

## 2015-05-16 ENCOUNTER — Observation Stay (HOSPITAL_COMMUNITY): Payer: Medicaid Other

## 2015-05-16 ENCOUNTER — Emergency Department (HOSPITAL_COMMUNITY): Payer: Self-pay

## 2015-05-16 ENCOUNTER — Encounter (HOSPITAL_COMMUNITY): Payer: Self-pay | Admitting: Emergency Medicine

## 2015-05-16 ENCOUNTER — Observation Stay (HOSPITAL_COMMUNITY)
Admission: EM | Admit: 2015-05-16 | Discharge: 2015-05-17 | Disposition: A | Discharge status: Home / Self Care | Payer: Self-pay | Attending: General Surgery | Admitting: General Surgery

## 2015-05-16 DIAGNOSIS — M25441 Effusion, right hand: Secondary | ICD-10-CM

## 2015-05-16 DIAGNOSIS — S91311A Laceration without foreign body, right foot, initial encounter: Secondary | ICD-10-CM | POA: Insufficient documentation

## 2015-05-16 DIAGNOSIS — S028XXA Fractures of other specified skull and facial bones, initial encounter for closed fracture: Secondary | ICD-10-CM | POA: Insufficient documentation

## 2015-05-16 DIAGNOSIS — S0181XA Laceration without foreign body of other part of head, initial encounter: Secondary | ICD-10-CM | POA: Insufficient documentation

## 2015-05-16 DIAGNOSIS — S62634A Displaced fracture of distal phalanx of right ring finger, initial encounter for closed fracture: Secondary | ICD-10-CM | POA: Insufficient documentation

## 2015-05-16 DIAGNOSIS — S0285XA Fracture of orbit, unspecified, initial encounter for closed fracture: Secondary | ICD-10-CM

## 2015-05-16 DIAGNOSIS — IMO0001 Reserved for inherently not codable concepts without codable children: Secondary | ICD-10-CM | POA: Diagnosis present

## 2015-05-16 DIAGNOSIS — S0280XA Fracture of other specified skull and facial bones, unspecified side, initial encounter for closed fracture: Secondary | ICD-10-CM

## 2015-05-16 DIAGNOSIS — S62632A Displaced fracture of distal phalanx of right middle finger, initial encounter for closed fracture: Principal | ICD-10-CM | POA: Insufficient documentation

## 2015-05-16 DIAGNOSIS — T07XXXA Unspecified multiple injuries, initial encounter: Secondary | ICD-10-CM

## 2015-05-16 DIAGNOSIS — Z880 Allergy status to penicillin: Secondary | ICD-10-CM | POA: Insufficient documentation

## 2015-05-16 DIAGNOSIS — F17210 Nicotine dependence, cigarettes, uncomplicated: Secondary | ICD-10-CM | POA: Insufficient documentation

## 2015-05-16 DIAGNOSIS — S60222A Contusion of left hand, initial encounter: Secondary | ICD-10-CM | POA: Insufficient documentation

## 2015-05-16 DIAGNOSIS — S022XXA Fracture of nasal bones, initial encounter for closed fracture: Secondary | ICD-10-CM | POA: Insufficient documentation

## 2015-05-16 DIAGNOSIS — S0003XA Contusion of scalp, initial encounter: Secondary | ICD-10-CM | POA: Insufficient documentation

## 2015-05-16 DIAGNOSIS — S62609A Fracture of unspecified phalanx of unspecified finger, initial encounter for closed fracture: Secondary | ICD-10-CM

## 2015-05-16 DIAGNOSIS — S0292XA Unspecified fracture of facial bones, initial encounter for closed fracture: Secondary | ICD-10-CM | POA: Diagnosis present

## 2015-05-16 DIAGNOSIS — Y929 Unspecified place or not applicable: Secondary | ICD-10-CM | POA: Insufficient documentation

## 2015-05-16 DIAGNOSIS — T1490XA Injury, unspecified, initial encounter: Secondary | ICD-10-CM

## 2015-05-16 DIAGNOSIS — S60221A Contusion of right hand, initial encounter: Secondary | ICD-10-CM | POA: Insufficient documentation

## 2015-05-16 DIAGNOSIS — Z8619 Personal history of other infectious and parasitic diseases: Secondary | ICD-10-CM | POA: Insufficient documentation

## 2015-05-16 LAB — I-STAT CHEM 8, ED
BUN: 15 mg/dL (ref 6–20)
Calcium, Ion: 1.14 mmol/L (ref 1.12–1.23)
Chloride: 107 mmol/L (ref 101–111)
Creatinine, Ser: 0.7 mg/dL (ref 0.44–1.00)
Glucose, Bld: 132 mg/dL — ABNORMAL HIGH (ref 65–99)
HEMATOCRIT: 40 % (ref 36.0–46.0)
Hemoglobin: 13.6 g/dL (ref 12.0–15.0)
POTASSIUM: 3.4 mmol/L — AB (ref 3.5–5.1)
Sodium: 138 mmol/L (ref 135–145)
TCO2: 20 mmol/L (ref 0–100)

## 2015-05-16 LAB — CBC
HCT: 38.2 % (ref 36.0–46.0)
Hemoglobin: 12.7 g/dL (ref 12.0–15.0)
MCH: 30.8 pg (ref 26.0–34.0)
MCHC: 33.2 g/dL (ref 30.0–36.0)
MCV: 92.7 fL (ref 78.0–100.0)
PLATELETS: 280 10*3/uL (ref 150–400)
RBC: 4.12 MIL/uL (ref 3.87–5.11)
RDW: 13.7 % (ref 11.5–15.5)
WBC: 13.2 10*3/uL — ABNORMAL HIGH (ref 4.0–10.5)

## 2015-05-16 LAB — I-STAT BETA HCG BLOOD, ED (MC, WL, AP ONLY): I-stat hCG, quantitative: <5 m[IU]/mL (ref <5)

## 2015-05-16 MED ORDER — TETANUS-DIPHTH-ACELL PERTUSSIS 5-2.5-18.5 LF-MCG/0.5 IM SUSP
0.5000 mL | Freq: Once | INTRAMUSCULAR | Status: DC
  Filled 2015-05-16: qty 0.5

## 2015-05-16 MED ORDER — HYDROMORPHONE HCL 1 MG/ML IJ SOLN
0.5000 mg | Freq: Once | INTRAMUSCULAR | Status: AC
  Administered 2015-05-16: 0.5 mg via INTRAVENOUS
  Filled 2015-05-16: qty 1

## 2015-05-16 MED ORDER — SODIUM CHLORIDE 0.9 % IV SOLN: 250.0000 mL | INTRAVENOUS | Status: DC | PRN

## 2015-05-16 MED ORDER — SODIUM CHLORIDE 0.9 % IJ SOLN: 3.0000 mL | INTRAMUSCULAR | Status: DC | PRN

## 2015-05-16 MED ORDER — SODIUM CHLORIDE 0.9 % IJ SOLN
3.0000 mL | Freq: Two times a day (BID) | INTRAMUSCULAR | Status: DC
  Administered 2015-05-17: 3 mL via INTRAVENOUS

## 2015-05-16 MED ORDER — OXYCODONE HCL 5 MG PO TABS
10.0000 mg | ORAL_TABLET | ORAL | Status: DC | PRN
  Administered 2015-05-17 (×2): 10 mg via ORAL
  Filled 2015-05-16 (×2): qty 2

## 2015-05-16 MED ORDER — ONDANSETRON HCL 4 MG PO TABS: 4.0000 mg | ORAL_TABLET | Freq: Four times a day (QID) | ORAL | Status: DC | PRN

## 2015-05-16 MED ORDER — HYDROMORPHONE HCL 1 MG/ML IJ SOLN
1.0000 mg | INTRAMUSCULAR | Status: DC | PRN
Start: 2015-05-16 — End: 2015-05-17
  Administered 2015-05-16: 1 mg via INTRAVENOUS
  Filled 2015-05-16: qty 1

## 2015-05-16 MED ORDER — BACITRACIN ZINC 500 UNIT/GM EX OINT
1.0000 "application " | TOPICAL_OINTMENT | Freq: Two times a day (BID) | CUTANEOUS | Status: DC
  Administered 2015-05-16: 1 via TOPICAL
  Filled 2015-05-16: qty 0.9

## 2015-05-16 MED ORDER — NAPROXEN 500 MG PO TABS: 500.0000 mg | ORAL_TABLET | Freq: Two times a day (BID) | ORAL | Status: DC

## 2015-05-16 MED ORDER — HYDROCODONE-ACETAMINOPHEN 5-325 MG PO TABS: 2.0000 | ORAL_TABLET | Freq: Once | ORAL | Status: DC

## 2015-05-16 MED ORDER — HYDROCODONE-ACETAMINOPHEN 5-325 MG PO TABS: 2.0000 | ORAL_TABLET | ORAL | Status: DC | PRN

## 2015-05-16 MED ORDER — ENOXAPARIN SODIUM 40 MG/0.4ML ~~LOC~~ SOLN
40.0000 mg | SUBCUTANEOUS | Status: DC
  Administered 2015-05-16: 40 mg via SUBCUTANEOUS
  Filled 2015-05-16: qty 0.4

## 2015-05-16 MED ORDER — RIVAROXABAN 15 MG PO TABS
15.0000 mg | ORAL_TABLET | Freq: Once | ORAL | Status: DC
  Filled 2015-05-16: qty 1

## 2015-05-16 MED ORDER — CLINDAMYCIN HCL 150 MG PO CAPS: 300.0000 mg | ORAL_CAPSULE | Freq: Three times a day (TID) | ORAL | Status: DC

## 2015-05-16 MED ORDER — FENTANYL CITRATE (PF) 100 MCG/2ML IJ SOLN
100.0000 ug | Freq: Once | INTRAMUSCULAR | Status: AC
  Administered 2015-05-16: 100 ug via INTRAVENOUS
  Filled 2015-05-16: qty 2

## 2015-05-16 MED ORDER — LIDOCAINE-EPINEPHRINE 1 %-1:100000 IJ SOLN
10.0000 mL | Freq: Once | INTRAMUSCULAR | Status: AC
  Administered 2015-05-16: 10 mL
  Filled 2015-05-16: qty 1

## 2015-05-16 MED ORDER — ONDANSETRON HCL 4 MG/2ML IJ SOLN: 4.0000 mg | Freq: Four times a day (QID) | INTRAMUSCULAR | Status: DC | PRN
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
## 2015-05-16 NOTE — ED Notes (Signed)
Social worker notified will work on placement
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
## 2015-05-16 NOTE — Progress Notes (Signed)
Orthopedic Tech Progress Note Patient Details:  Joy Juarez 07/11/1976 [id number]  Ortho Devices Type of Ortho Device: Finger splint Ortho Device/Splint Location: rue Ortho Device/Splint Interventions: Application   Nikki Dom 05/16/2015, 2:34 PM

## 2015-05-16 NOTE — Discharge Instructions (Signed)
Your stitches should come out in 7 days - see your doctor for this.  You have several broken bones, your nose (will heal without trouble), your eye socket (small fracture - likely doesn't need surgery) and a finger fracture - which will heal by itself as well.  Call the ENT specialist above to see them in the office this week in follow up  Ice on the bruising,  Pain medicine as prescribed Drink plenty of fluids.  Please call your doctor for a followup appointment within 24-48 hours. When you talk to your doctor please let them know that you were seen in the emergency department and have them acquire all of your records so that they can discuss the findings with you and formulate a treatment plan to fully care for your new and ongoing problems.
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
## 2015-05-16 NOTE — ED Notes (Signed)
Pt c/o vaginal odor and discharge- states "I had an infection before, I need to get this checked out"
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
## 2015-05-17 DIAGNOSIS — S0292XA Unspecified fracture of facial bones, initial encounter for closed fracture: Secondary | ICD-10-CM | POA: Diagnosis present

## 2015-05-17 DIAGNOSIS — S62632A Displaced fracture of distal phalanx of right middle finger, initial encounter for closed fracture: Secondary | ICD-10-CM

## 2015-05-17 DIAGNOSIS — S0181XA Laceration without foreign body of other part of head, initial encounter: Secondary | ICD-10-CM | POA: Diagnosis present

## 2015-05-17 DIAGNOSIS — IMO0001 Reserved for inherently not codable concepts without codable children: Secondary | ICD-10-CM | POA: Diagnosis present

## 2015-05-17 DIAGNOSIS — S91311A Laceration without foreign body, right foot, initial encounter: Secondary | ICD-10-CM | POA: Diagnosis present

## 2015-05-17 DIAGNOSIS — T07XXXA Unspecified multiple injuries, initial encounter: Secondary | ICD-10-CM

## 2015-05-17 MED ORDER — HYDROMORPHONE HCL 1 MG/ML IJ SOLN: 0.5000 mg | INTRAMUSCULAR | Status: DC | PRN

## 2015-05-17 MED ORDER — HYDROCODONE-ACETAMINOPHEN 5-325 MG PO TABS: 2.0000 | ORAL_TABLET | ORAL | Status: DC | PRN

## 2015-05-17 MED ORDER — NAPROXEN 500 MG PO TABS: 500.0000 mg | ORAL_TABLET | Freq: Two times a day (BID) | ORAL | Status: DC

## 2015-05-17 MED ORDER — OXYCODONE HCL 5 MG PO TABS
5.0000 mg | ORAL_TABLET | ORAL | Status: DC | PRN
  Administered 2015-05-17: 10 mg via ORAL
  Filled 2015-05-17: qty 2

## 2015-05-17 MED ORDER — NAPROXEN 250 MG PO TABS
500.0000 mg | ORAL_TABLET | Freq: Two times a day (BID) | ORAL | Status: DC
  Administered 2015-05-17: 500 mg via ORAL
  Filled 2015-05-17: qty 2

## 2015-05-17 MED ORDER — CLINDAMYCIN HCL 150 MG PO CAPS: 300.0000 mg | ORAL_CAPSULE | Freq: Three times a day (TID) | ORAL | Status: DC
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
## 2015-05-17 NOTE — Progress Notes (Signed)
Discharge instructions/prescriptions given and explained to pt.  Pt verbalized understanding of all orders/prescriptions and denies any questions at this time.  IV x2 removed and site CDI.  Pt discharged to home via taxi cab voucher with all belongings and in no s/s of distress. Sherald Barge
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
## 2015-10-11 ENCOUNTER — Encounter (HOSPITAL_COMMUNITY): Payer: Self-pay | Admitting: *Deleted

## 2015-10-11 ENCOUNTER — Emergency Department (HOSPITAL_COMMUNITY): Payer: Medicaid Other

## 2015-10-11 ENCOUNTER — Emergency Department (HOSPITAL_COMMUNITY)
Admission: EM | Admit: 2015-10-11 | Discharge: 2015-10-11 | Disposition: A | Discharge status: Home / Self Care | Payer: Medicaid Other | Attending: Emergency Medicine | Admitting: Emergency Medicine

## 2015-10-11 DIAGNOSIS — Z3A00 Weeks of gestation of pregnancy not specified: Secondary | ICD-10-CM | POA: Insufficient documentation

## 2015-10-11 DIAGNOSIS — M25561 Pain in right knee: Secondary | ICD-10-CM

## 2015-10-11 DIAGNOSIS — S80211A Abrasion, right knee, initial encounter: Secondary | ICD-10-CM | POA: Insufficient documentation

## 2015-10-11 DIAGNOSIS — F17210 Nicotine dependence, cigarettes, uncomplicated: Secondary | ICD-10-CM | POA: Insufficient documentation

## 2015-10-11 DIAGNOSIS — Z3490 Encounter for supervision of normal pregnancy, unspecified, unspecified trimester: Secondary | ICD-10-CM

## 2015-10-11 DIAGNOSIS — W1789XA Other fall from one level to another, initial encounter: Secondary | ICD-10-CM | POA: Insufficient documentation

## 2015-10-11 DIAGNOSIS — Y998 Other external cause status: Secondary | ICD-10-CM | POA: Insufficient documentation

## 2015-10-11 DIAGNOSIS — Z79899 Other long term (current) drug therapy: Secondary | ICD-10-CM | POA: Insufficient documentation

## 2015-10-11 DIAGNOSIS — O99330 Smoking (tobacco) complicating pregnancy, unspecified trimester: Secondary | ICD-10-CM | POA: Insufficient documentation

## 2015-10-11 DIAGNOSIS — Z8619 Personal history of other infectious and parasitic diseases: Secondary | ICD-10-CM | POA: Insufficient documentation

## 2015-10-11 DIAGNOSIS — O9A219 Injury, poisoning and certain other consequences of external causes complicating pregnancy, unspecified trimester: Secondary | ICD-10-CM | POA: Insufficient documentation

## 2015-10-11 DIAGNOSIS — Y9389 Activity, other specified: Secondary | ICD-10-CM | POA: Insufficient documentation

## 2015-10-11 DIAGNOSIS — Y9289 Other specified places as the place of occurrence of the external cause: Secondary | ICD-10-CM | POA: Insufficient documentation

## 2015-10-11 DIAGNOSIS — F1410 Cocaine abuse, uncomplicated: Secondary | ICD-10-CM

## 2015-10-11 DIAGNOSIS — Z880 Allergy status to penicillin: Secondary | ICD-10-CM | POA: Insufficient documentation

## 2015-10-11 DIAGNOSIS — O99320 Drug use complicating pregnancy, unspecified trimester: Secondary | ICD-10-CM | POA: Insufficient documentation

## 2015-10-11 LAB — I-STAT CHEM 8, ED
BUN: 10 mg/dL (ref 6–20)
CALCIUM ION: 1.12 mmol/L (ref 1.12–1.23)
Chloride: 102 mmol/L (ref 101–111)
Creatinine, Ser: 0.4 mg/dL — ABNORMAL LOW (ref 0.44–1.00)
Glucose, Bld: 92 mg/dL (ref 65–99)
HCT: 32 % — ABNORMAL LOW (ref 36.0–46.0)
Hemoglobin: 10.9 g/dL — ABNORMAL LOW (ref 12.0–15.0)
Potassium: 3.6 mmol/L (ref 3.5–5.1)
SODIUM: 135 mmol/L (ref 135–145)
TCO2: 21 mmol/L (ref 0–100)

## 2015-10-11 MED ORDER — PRENATAL COMPLETE 14-0.4 MG PO TABS: 1.0000 | ORAL_TABLET | Freq: Every day | ORAL | Status: DC

## 2015-10-11 NOTE — ED Notes (Signed)
Per women's rapid response, pt may be able to come off the monitor to go to xray,

## 2015-10-11 NOTE — ED Notes (Signed)
Fetal monitor applied, fetal heart tones 130's, women's notified,
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
## 2015-10-11 NOTE — Progress Notes (Signed)
Spoke with Dr Macon LargeAnyanwu about patient at [facility]. Notified of patient status: Per [facility] ED MD patient is roughly 16 weeks per fundal height measurement. FHT is reactive. Per Dr Macon LargeAnyanwu patient is [facility]. [facility] ER RN Bonita QuinLinda notified.
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
## 2015-10-11 NOTE — Discharge Instructions (Signed)
Take the prenatal vitamins, if you run out, you can also buy any OTC prenatal vitamins. You need to get prenatal care. You can take acetaminophen for your leg pain, 650 mg every 6 hrs if needed. You can use ice packs on the painful areas. Recheck if you get abdominal pain, vaginal bleeding or seem worse.

## 2015-10-11 NOTE — ED Notes (Signed)
Pt released to [employer],

## 2015-10-11 NOTE — ED Notes (Signed)
Rapid response at women's notified of pt being placed on monitor,

## 2016-08-15 ENCOUNTER — Encounter (HOSPITAL_COMMUNITY): Payer: Self-pay

## 2017-08-05 ENCOUNTER — Encounter (HOSPITAL_COMMUNITY): Payer: Self-pay | Admitting: *Deleted

## 2017-08-05 ENCOUNTER — Emergency Department (HOSPITAL_COMMUNITY)
Admission: EM | Admit: 2017-08-05 | Discharge: 2017-08-06 | Disposition: A | Discharge status: Other Acute Inpatient Hospital | Payer: Medicaid Other | Attending: Emergency Medicine | Admitting: Emergency Medicine

## 2017-08-05 DIAGNOSIS — R4689 Other symptoms and signs involving appearance and behavior: Secondary | ICD-10-CM

## 2017-08-05 DIAGNOSIS — E876 Hypokalemia: Secondary | ICD-10-CM | POA: Diagnosis not present

## 2017-08-05 DIAGNOSIS — R451 Restlessness and agitation: Secondary | ICD-10-CM | POA: Insufficient documentation

## 2017-08-05 DIAGNOSIS — F1410 Cocaine abuse, uncomplicated: Secondary | ICD-10-CM | POA: Diagnosis not present

## 2017-08-05 DIAGNOSIS — Z79899 Other long term (current) drug therapy: Secondary | ICD-10-CM | POA: Diagnosis not present

## 2017-08-05 DIAGNOSIS — F329 Major depressive disorder, single episode, unspecified: Secondary | ICD-10-CM | POA: Insufficient documentation

## 2017-08-05 DIAGNOSIS — F32A Depression, unspecified: Secondary | ICD-10-CM

## 2017-08-05 HISTORY — DX: Cocaine abuse, uncomplicated: F14.10

## 2017-08-05 HISTORY — DX: Major depressive disorder, single episode, unspecified: F32.9

## 2017-08-05 HISTORY — DX: Depression, unspecified: F32.A

## 2017-08-05 LAB — CBC WITH DIFFERENTIAL/PLATELET
Basophils Absolute: 0 10*3/uL (ref 0.0–0.1)
Basophils Relative: 0 %
Eosinophils Absolute: 0.2 10*3/uL (ref 0.0–0.7)
Eosinophils Relative: 2 %
HCT: 34.5 % — ABNORMAL LOW (ref 36.0–46.0)
Hemoglobin: 11 g/dL — ABNORMAL LOW (ref 12.0–15.0)
Lymphocytes Relative: 27 %
Lymphs Abs: 2.2 10*3/uL (ref 0.7–4.0)
MCH: 26.5 pg (ref 26.0–34.0)
MCHC: 31.9 g/dL (ref 30.0–36.0)
MCV: 83.1 fL (ref 78.0–100.0)
MONOS PCT: 8 %
Monocytes Absolute: 0.6 10*3/uL (ref 0.1–1.0)
NEUTROS ABS: 5 10*3/uL (ref 1.7–7.7)
NEUTROS PCT: 63 %
PLATELETS: 351 10*3/uL (ref 150–400)
RBC: 4.15 MIL/uL (ref 3.87–5.11)
RDW: 15.2 % (ref 11.5–15.5)
WBC: 8.1 10*3/uL (ref 4.0–10.5)

## 2017-08-05 LAB — RAPID URINE DRUG SCREEN, HOSP PERFORMED
Amphetamines: NOT DETECTED
BARBITURATES: NOT DETECTED
Benzodiazepines: NOT DETECTED
COCAINE: POSITIVE — AB
Opiates: NOT DETECTED
Tetrahydrocannabinol: POSITIVE — AB

## 2017-08-05 LAB — SALICYLATE LEVEL: Salicylate Lvl: <7 mg/dL (ref 2.8–30.0)

## 2017-08-05 LAB — COMPREHENSIVE METABOLIC PANEL
ALT: 20 U/L (ref 14–54)
AST: 20 U/L (ref 15–41)
Albumin: 3.7 g/dL (ref 3.5–5.0)
Alkaline Phosphatase: 64 U/L (ref 38–126)
Anion gap: 9 (ref 5–15)
BUN: 11 mg/dL (ref 6–20)
CHLORIDE: 104 mmol/L (ref 101–111)
CO2: 26 mmol/L (ref 22–32)
Calcium: 9 mg/dL (ref 8.9–10.3)
Creatinine, Ser: 0.75 mg/dL (ref 0.44–1.00)
GFR calc Af Amer: >60 mL/min (ref >60)
GFR calc non Af Amer: >60 mL/min (ref >60)
Glucose, Bld: 114 mg/dL — ABNORMAL HIGH (ref 65–99)
POTASSIUM: 2.8 mmol/L — AB (ref 3.5–5.1)
Sodium: 139 mmol/L (ref 135–145)
Total Bilirubin: 0.6 mg/dL (ref 0.3–1.2)
Total Protein: 7.5 g/dL (ref 6.5–8.1)

## 2017-08-05 LAB — ETHANOL: Alcohol, Ethyl (B): <5 mg/dL (ref <5)

## 2017-08-05 LAB — PREGNANCY, URINE: PREG TEST UR: NEGATIVE

## 2017-08-05 LAB — ACETAMINOPHEN LEVEL: Acetaminophen (Tylenol), Serum: <10 ug/mL — ABNORMAL LOW (ref 10–30)

## 2017-08-05 MED ORDER — POTASSIUM CHLORIDE CRYS ER 20 MEQ PO TBCR
40.0000 meq | EXTENDED_RELEASE_TABLET | Freq: Three times a day (TID) | ORAL | Status: AC
  Administered 2017-08-05 (×3): 40 meq via ORAL
  Filled 2017-08-05 (×3): qty 2

## 2017-08-05 MED ORDER — IBUPROFEN 400 MG PO TABS: 600.0000 mg | ORAL_TABLET | Freq: Three times a day (TID) | ORAL | Status: DC | PRN

## 2017-08-05 NOTE — ED Notes (Signed)
Pt awakened to eat lunch - pt ate approximately 50-60 per cent then promptly fell back asleep Per report, pt was cathed from urine earlier and her perineum is reddened, raw and she has started her menses

## 2017-08-05 NOTE — ED Notes (Signed)
Received report on pt, pt lying supine on stretcher, no distress noted, sitter remains at bedside,

## 2017-08-05 NOTE — ED Notes (Signed)
Pt given tray, up eating at this time. Pt able to stay awake a little better at this time.

## 2017-08-05 NOTE — BH Assessment (Signed)
[facility] Assessment Progress Note     Attempted to assess patient, but she was too sleepy and could not stay awake. Patient admitted to using drugs prior to her admission to the ED, but TTS could no get an accurate drug use history at this time.  Will reassess once the patient is awake and more alert.

## 2017-08-05 NOTE — ED Notes (Signed)
Pt offered fluids but pt continued to fall asleep and drop her water cup in the floor. Pt unable to stay awake to drink fluids at this time.
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
## 2017-08-05 NOTE — ED Notes (Signed)
IVC papers are complete. Lab called. Security called.

## 2017-08-05 NOTE — ED Notes (Signed)
Pt ate 50 per cent of meal 

## 2017-08-05 NOTE — ED Notes (Signed)
Pt is given supper and meds- She is conversant and appropriate When asked regarding her perineum and the irritation, she denies sex for drugs  She is conversant answers questions when asked

## 2017-08-05 NOTE — ED Notes (Signed)
TTS in progress. Pt easily falls asleep during assessment. Counselor reports to RN that he will call back in about 1 hour to complete telepsych assessment.

## 2017-08-05 NOTE — ED Notes (Signed)
Pt ambulatory to bathroom, pt instructed to urinate in cup, pt did not, pt urinated in toilet and said she forgot. Pt did admit to this nurse that she had been smoking crack and up for several days. Pt tearful. Pt given water and instructed to provide urine sample when able.

## 2017-08-05 NOTE — ED Notes (Signed)
Out of bed to BR  Ambulates heel to toe without stagger or drift
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
## 2017-08-05 NOTE — ED Notes (Signed)
Pt given breakfast tray, pt stated "thank you so much, it has been a couple of days since I ate".

## 2017-08-05 NOTE — ED Notes (Signed)
Pt ambulatory to bathroom- pt requesting that this nurse assess the wound on her buttocks (left gluteus) approx size of quarter, skin surrounding is red, wound bed consists of adherant tan/light brown exudate.   Pt reports she has "been squeezing it". Pt says she is unable to provide urine sample at this time and refuses to have blood drawn. Dr Lynelle Doctor made aware.

## 2017-08-05 NOTE — ED Notes (Signed)
IVC papers initiated by Dr Lynelle Doctor

## 2017-08-05 NOTE — ED Notes (Signed)
[facility] called and said to d/c order for telepsych consult at this time because pt is falling asleep during assessment and unable to complete at this time. [facility] said to reorder when pt is alert and able to complete assessment. EDP made aware. Consult d/c at this time.
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
## 2017-08-06 ENCOUNTER — Inpatient Hospital Stay (HOSPITAL_COMMUNITY)
Admission: AD | Admit: 2017-08-06 | Discharge: 2017-08-10 | DRG: 897 | Disposition: A | Discharge status: Home / Self Care | Payer: Medicaid Other | Attending: Psychiatry | Admitting: Psychiatry

## 2017-08-06 ENCOUNTER — Encounter (HOSPITAL_COMMUNITY): Payer: Self-pay | Admitting: *Deleted

## 2017-08-06 DIAGNOSIS — F1424 Cocaine dependence with cocaine-induced mood disorder: Principal | ICD-10-CM | POA: Diagnosis present

## 2017-08-06 DIAGNOSIS — F17210 Nicotine dependence, cigarettes, uncomplicated: Secondary | ICD-10-CM | POA: Diagnosis present

## 2017-08-06 DIAGNOSIS — Z91410 Personal history of adult physical and sexual abuse: Secondary | ICD-10-CM | POA: Diagnosis not present

## 2017-08-06 DIAGNOSIS — G4700 Insomnia, unspecified: Secondary | ICD-10-CM | POA: Diagnosis not present

## 2017-08-06 DIAGNOSIS — F39 Unspecified mood [affective] disorder: Secondary | ICD-10-CM | POA: Diagnosis not present

## 2017-08-06 DIAGNOSIS — F1110 Opioid abuse, uncomplicated: Secondary | ICD-10-CM | POA: Diagnosis present

## 2017-08-06 DIAGNOSIS — F419 Anxiety disorder, unspecified: Secondary | ICD-10-CM | POA: Diagnosis not present

## 2017-08-06 DIAGNOSIS — R45851 Suicidal ideations: Secondary | ICD-10-CM | POA: Diagnosis not present

## 2017-08-06 DIAGNOSIS — F329 Major depressive disorder, single episode, unspecified: Secondary | ICD-10-CM | POA: Diagnosis present

## 2017-08-06 DIAGNOSIS — Z833 Family history of diabetes mellitus: Secondary | ICD-10-CM | POA: Diagnosis not present

## 2017-08-06 MED ORDER — HYDROXYZINE HCL 25 MG PO TABS
25.0000 mg | ORAL_TABLET | Freq: Three times a day (TID) | ORAL | Status: DC | PRN
  Administered 2017-08-06 – 2017-08-08 (×2): 25 mg via ORAL
  Filled 2017-08-06 (×2): qty 1

## 2017-08-06 MED ORDER — ACETAMINOPHEN 325 MG PO TABS: 650.0000 mg | ORAL_TABLET | Freq: Four times a day (QID) | ORAL | Status: DC | PRN

## 2017-08-06 MED ORDER — TRAZODONE HCL 50 MG PO TABS
50.0000 mg | ORAL_TABLET | Freq: Every evening | ORAL | Status: DC | PRN
  Filled 2017-08-06: qty 1

## 2017-08-06 MED ORDER — MAGNESIUM HYDROXIDE 400 MG/5ML PO SUSP: 30.0000 mL | Freq: Every day | ORAL | Status: DC | PRN

## 2017-08-06 MED ORDER — FLUOXETINE HCL 20 MG PO CAPS
20.0000 mg | ORAL_CAPSULE | Freq: Every day | ORAL | Status: DC
  Administered 2017-08-06 – 2017-08-10 (×5): 20 mg via ORAL
  Filled 2017-08-06 (×8): qty 1

## 2017-08-06 MED ORDER — ALUM & MAG HYDROXIDE-SIMETH 200-200-20 MG/5ML PO SUSP: 30.0000 mL | ORAL | Status: DC | PRN

## 2017-08-06 NOTE — ED Notes (Signed)
Pt sleeping on left side, no distress noted, sitter remains at bedside,
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
## 2017-08-06 NOTE — ED Notes (Signed)
Per [facility], pt has been accepted to [facility] and can come as soon as transport is arranged. Accepting: Melvyn Neth, NP Attending: [place] Room assignment: 307-1. Number for report [phone]. Primary RN and EDP aware.

## 2017-08-06 NOTE — ED Notes (Signed)
TTS in progress 
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
## 2017-08-06 NOTE — Tx Team (Signed)
Initial Treatment Plan 08/06/2017 8:56 PM Ursula Alert Kizzie Furnish UJW:[id number]    PATIENT STRESSORS: Loss of custody of children Substance abuse   PATIENT STRENGTHS: Ability for insight Communication skills Motivation for treatment/growth Supportive family/friends   PATIENT IDENTIFIED PROBLEMS: Substance Abuse  "Get son back"  "Getting back into my parents home with my daughter"                  DISCHARGE CRITERIA:  Ability to meet basic life and health needs Improved stabilization in mood, thinking, and/or behavior Motivation to continue treatment in a less acute level of care Need for constant or close observation no longer present Withdrawal symptoms are absent or subacute and managed without 24-hour nursing intervention  PRELIMINARY DISCHARGE PLAN: Attend 12-step recovery group Outpatient therapy Placement in alternative living arrangements  PATIENT/FAMILY INVOLVEMENT: This treatment plan has been presented to and reviewed with the patient, Joy Juarez.  The patient and family have been given the opportunity to ask questions and make suggestions.  Carleene Overlie, RN 08/06/2017, 8:56 PM

## 2017-08-06 NOTE — Progress Notes (Signed)
Per Berneice Heinrich , [facility], patient has been accepted to [facility] bed 307-1 ; Accepting provider is Hillery Jacks, NP; Attending provider is Dr. Jama Flavors.  Patient can arrive now, bed is ready. Number for report is [phone].    Esperanza Richters, RN notified and agreed to notify the patient's nurse/EDP.   Baldo Daub MSW, LCSWA CSW Disposition [phone]

## 2017-08-06 NOTE — ED Notes (Signed)
Warm blanket provided, 

## 2017-08-06 NOTE — ED Notes (Signed)
[facility] transport here to take patient to [facility].

## 2017-08-07 DIAGNOSIS — R45851 Suicidal ideations: Secondary | ICD-10-CM

## 2017-08-07 DIAGNOSIS — F1424 Cocaine dependence with cocaine-induced mood disorder: Secondary | ICD-10-CM

## 2017-08-07 DIAGNOSIS — Z91410 Personal history of adult physical and sexual abuse: Secondary | ICD-10-CM

## 2017-08-07 LAB — TSH: TSH: 20.053 u[IU]/mL — ABNORMAL HIGH (ref 0.350–4.500)

## 2017-08-07 MED ORDER — NICOTINE 21 MG/24HR TD PT24
21.0000 mg | MEDICATED_PATCH | Freq: Every day | TRANSDERMAL | Status: DC
  Filled 2017-08-07 (×6): qty 1

## 2017-08-07 MED ORDER — POTASSIUM CHLORIDE CRYS ER 20 MEQ PO TBCR
20.0000 meq | EXTENDED_RELEASE_TABLET | Freq: Two times a day (BID) | ORAL | Status: DC
  Administered 2017-08-07 – 2017-08-08 (×2): 20 meq via ORAL
  Filled 2017-08-07 (×4): qty 1
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
## 2017-08-07 NOTE — BHH Group Notes (Signed)
Pt did not attend wrap-up group   

## 2017-08-07 NOTE — Progress Notes (Signed)
Patient has rested in bed all day. "I just need to catch up on my sleep." Minimal interaction, information forwarded. Compliant with medications. Cooperative. Remains anxious. No SI/HI. Safe on level III obs.
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
## 2017-08-07 NOTE — Plan of Care (Signed)
Problem: Education: Goal: Verbalization of understanding the information provided will improve Outcome: Progressing Patient verbalizes understanding of information, education provided.   

## 2017-08-07 NOTE — Progress Notes (Signed)
D: Pt was in the hallway upon initial approach.  Pt presents with anxious affect and mood.  When asked about her day, pt states "it's been better, I'm kind of adjusting."  She reports she had a good visit tonight.  Pt denies SI/HI, denies hallucinations, denies pain.  Pt has been visible in milieu interacting with peers and staff appropriately.     A: Introduced self to pt.  Actively listened to pt and offered support and encouragement.  PRN medication administered for anxiety.  Q15 minute safety checks maintained.  R: Pt is safe on the unit.  Pt is compliant with medication.  Pt verbally contracts for safety.  Will continue to monitor and assess.

## 2017-08-07 NOTE — Plan of Care (Signed)
Problem: Safety: Goal: Periods of time without injury will increase Outcome: Progressing Pt denies SI/HI and she verbally contracts for safety.

## 2017-08-07 NOTE — BHH Group Notes (Signed)
[facility] Mental [employer] Group Therapy 08/07/2017 1:15pm  Type of Therapy: Mental Health Association Presentation  Participation Level: Active  Participation Quality: Attentive  Affect: Appropriate  Cognitive: Oriented  Insight: Developing/Improving  Engagement in Therapy: Engaged  Modes of Intervention: Discussion, Education and Socialization  Summary of Progress/Problems: Mental Health Association (MHA) Speaker came to talk about his personal journey with living with a mental health diagnosis. The pt processed ways by which to relate to the speaker. MHA speaker provided handouts and educational information pertaining to groups and services offered by the [facility]. Pt was engaged in speaker's presentation and was receptive to resources provided.    Jonathon Jordan, MSW, LCSWA 08/07/2017 3:50 PM

## 2017-08-07 NOTE — Progress Notes (Signed)
Recreation Therapy Notes  Animal-Assisted Activity (AAA) Program Checklist/Progress Notes Patient Eligibility Criteria Checklist & Daily Group note for Rec TxIntervention  Date: 09.18.2018 Time: 2:45pm Location: [address] Dayroom   AAA/T Program Assumption of Risk Form signed by Patient/ or Parent Legal Guardian Yes  Patient is free of allergies or sever asthma Yes  Patient reports no fear of animals Yes  Patient reports no history of cruelty to animals Yes  Patient understands his/her participation is voluntary Yes  Behavioral Response: Did not attend  Delshawn Stech L Austyn Seier, LRT/CTRS       Mayrene Bastarache L 08/07/2017 3:00 PM 

## 2017-08-07 NOTE — Progress Notes (Signed)
D: Patient observed resting in bed this AM however did come up for AM meds per request of this Clinical research associate. Patient forwards minimal information however is cooperative. Patient's affect anxious with congruent mood. Per self inventory and discussions with writer, rates depression at a 5/10, hopelessness at a 3/10 and anxiety at a 5/10. Rates sleep as good, appetite as good, energy as low and concentration as good.  States goal for today is to "rest, get my mind together." Denies pain, physical problems.   A: Medicated per orders, no prns requested or required. Level III obs in place for safety. Emotional support offered and self inventory reviewed. Encouraged completion of Suicide Safety Plan and programming participation. Discussed POC with MD, SW.    R: Patient verbalizes understanding of POC. Patient denies SI/HI/AVH and remains safe on level III obs. Will continue to monitor closely and make verbal contact frequently.
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
## 2017-08-08 DIAGNOSIS — F17210 Nicotine dependence, cigarettes, uncomplicated: Secondary | ICD-10-CM

## 2017-08-08 DIAGNOSIS — G4700 Insomnia, unspecified: Secondary | ICD-10-CM

## 2017-08-08 DIAGNOSIS — F329 Major depressive disorder, single episode, unspecified: Secondary | ICD-10-CM

## 2017-08-08 DIAGNOSIS — F39 Unspecified mood [affective] disorder: Secondary | ICD-10-CM

## 2017-08-08 LAB — BASIC METABOLIC PANEL
ANION GAP: 9 (ref 5–15)
BUN: 13 mg/dL (ref 6–20)
CO2: 23 mmol/L (ref 22–32)
Calcium: 8.8 mg/dL — ABNORMAL LOW (ref 8.9–10.3)
Chloride: 104 mmol/L (ref 101–111)
Creatinine, Ser: 0.72 mg/dL (ref 0.44–1.00)
GFR calc Af Amer: >60 mL/min (ref >60)
GFR calc non Af Amer: >60 mL/min (ref >60)
Glucose, Bld: 106 mg/dL — ABNORMAL HIGH (ref 65–99)
POTASSIUM: 4.2 mmol/L (ref 3.5–5.1)
Sodium: 136 mmol/L (ref 135–145)

## 2017-08-08 LAB — T4, FREE: Free T4: 0.72 ng/dL (ref 0.61–1.12)

## 2017-08-08 NOTE — BHH Group Notes (Signed)
LCSW Group Therapy Note  08/08/2017 1:15pm  Type of Therapy/Topic:  Group Therapy:  Emotion Regulation  Participation Level: Pt invited. Did not attend.   Jonathon Jordan, MSW, LCSWA 08/08/2017 4:06 PM

## 2017-08-08 NOTE — Progress Notes (Signed)
Pt has been in her room in bed all evening.  She sat up for the MHT to obtain her vital signs and answered the shift assessment questions.  She denies SI/HI/AVH at this time.  She reports no withdrawal symptoms at this time.  She voices no needs or concerns.  She declined a sleep aid for tonight, stating she wants to try to sleep without taking anything.  She was pleasant and cooperative. Pt was encouraged to make her needs known to staff.  Support and encouragement offered.  Pt wants to go for long term treatment after her detox.  Safety maintained with q15 minute checks.

## 2017-08-08 NOTE — Tx Team (Signed)
Interdisciplinary Treatment and Diagnostic Plan Update 08/08/2017 Time of Session: 9:30am  Joy Juarez  MRN: [id number]  Principal Diagnosis: Cocaine dependence with cocaine-induced mood disorder (HCC)  Secondary Diagnoses: Principal Problem:   Cocaine dependence with cocaine-induced mood disorder (HCC) Active Problems:   MDD (major depressive disorder)   Current Medications:  Current Facility-Administered Medications  Medication Dose Route Frequency Provider Last Rate Last Dose  . acetaminophen (TYLENOL) tablet 650 mg  650 mg Oral Q6H PRN Oneta Rack, NP      . alum & mag hydroxide-simeth (MAALOX/MYLANTA) 200-200-20 MG/5ML suspension 30 mL  30 mL Oral Q4H PRN Oneta Rack, NP      . FLUoxetine (PROZAC) capsule 20 mg  20 mg Oral Daily Oneta Rack, NP   20 mg at 08/08/17 0846  . hydrOXYzine (ATARAX/VISTARIL) tablet 25 mg  25 mg Oral TID PRN Oneta Rack, NP   25 mg at 08/06/17 2133  . magnesium hydroxide (MILK OF MAGNESIA) suspension 30 mL  30 mL Oral Daily PRN Oneta Rack, NP      . nicotine (NICODERM CQ - dosed in mg/24 hours) patch 21 mg  21 mg Transdermal Daily Cobos, Fernando A, MD      . traZODone (DESYREL) tablet 50 mg  50 mg Oral QHS PRN Oneta Rack, NP        PTA Medications: Prescriptions Prior to Admission  Medication Sig Dispense Refill Last Dose  . Prenatal Vit-Fe Fumarate-FA (PRENATAL COMPLETE) 14-0.4 MG TABS Take 1 tablet by mouth daily. 60 each 0     Treatment Modalities: Medication Management, Group therapy, Case management,  1 to 1 session with clinician, Psychoeducation, Recreational therapy.  Patient Stressors: Loss of custody of children Substance abuse Patient Strengths: Ability for insight Barrister's clerk for treatment/growth Supportive family/friends  Physician Treatment Plan for Primary Diagnosis: Cocaine dependence with cocaine-induced mood disorder (HCC) Long Term Goal(s): Improvement in symptoms so as ready  for discharge Short Term Goals: Ability to identify changes in lifestyle to reduce recurrence of condition will improve Ability to verbalize feelings will improve Ability to identify and develop effective coping behaviors will improve Ability to maintain clinical measurements within normal limits will improve Ability to identify changes in lifestyle to reduce recurrence of condition will improve Ability to verbalize feelings will improve Ability to identify triggers associated with substance abuse/mental health issues will improve  Medication Management: Evaluate patient's response, side effects, and tolerance of medication regimen.  Therapeutic Interventions: 1 to 1 sessions, Unit Group sessions and Medication administration.  Evaluation of Outcomes: Progressing  Physician Treatment Plan for Secondary Diagnosis: Principal Problem:   Cocaine dependence with cocaine-induced mood disorder (HCC) Active Problems:   MDD (major depressive disorder)  Long Term Goal(s): Improvement in symptoms so as ready for discharge  Short Term Goals: Ability to identify changes in lifestyle to reduce recurrence of condition will improve Ability to verbalize feelings will improve Ability to identify and develop effective coping behaviors will improve Ability to maintain clinical measurements within normal limits will improve Ability to identify changes in lifestyle to reduce recurrence of condition will improve Ability to verbalize feelings will improve Ability to identify triggers associated with substance abuse/mental health issues will improve  Medication Management: Evaluate patient's response, side effects, and tolerance of medication regimen.  Therapeutic Interventions: 1 to 1 sessions, Unit Group sessions and Medication administration.  Evaluation of Outcomes: Progressing  RN Treatment Plan for Primary Diagnosis: Cocaine dependence with cocaine-induced mood disorder (HCC) Long  Term Goal(s):  Knowledge of disease and therapeutic regimen to maintain health will improve  Short Term Goals: Ability to demonstrate self-control, Ability to verbalize feelings will improve and Compliance with prescribed medications will improve  Medication Management: RN will administer medications as ordered by provider, will assess and evaluate patient's response and provide education to patient for prescribed medication. RN will report any adverse and/or side effects to prescribing provider.  Therapeutic Interventions: 1 on 1 counseling sessions, Psychoeducation, Medication administration, Evaluate responses to treatment, Monitor vital signs and CBGs as ordered, Perform/monitor CIWA, COWS, AIMS and Fall Risk screenings as ordered, Perform wound care treatments as ordered.  Evaluation of Outcomes: Progressing  LCSW Treatment Plan for Primary Diagnosis: Cocaine dependence with cocaine-induced mood disorder (HCC) Long Term Goal(s): Safe transition to appropriate next level of care at discharge, Engage patient in therapeutic group addressing interpersonal concerns. Short Term Goals: Engage patient in aftercare planning with referrals and resources, Facilitate patient progression through stages of change regarding substance use diagnoses and concerns, Identify triggers associated with mental health/substance abuse issues and Increase skills for wellness and recovery  Therapeutic Interventions: Assess for all discharge needs, 1 to 1 time with Social worker, Explore available resources and support systems, Assess for adequacy in community support network, Educate family and significant other(s) on suicide prevention, Complete Psychosocial Assessment, Interpersonal group therapy.  Evaluation of Outcomes: Progressing  Progress in Treatment: Attending groups: No Participating in groups: No Taking medication as prescribed: Yes, MD continues to assess for medication changes as needed Toleration medication: Yes, no  side effects reported at this time Family/Significant other contact made: No, CSW attempting contact with pt's mother  Patient understands diagnosis: Developing insight  Discussing patient identified problems/goals with staff: Yes Medical problems stabilized or resolved: Yes Denies suicidal/homicidal ideation: Yes  Issues/concerns per patient self-inventory: None Other: N/A  New problem(s) identified: None identified at this time.   New Short Term/Long Term Goal(s): "Leave from here and go to rehab"  Discharge Plan or Barriers: Pt will discharge to [facility] for residential treatment if a bed is available.   Reason for Continuation of Hospitalization:  Anxiety  Depression Medication stabilization Suicidal ideation Withdrawal symptoms  Estimated Length of Stay: 3-5 days; Estimated discharge date 08/13/17  Attendees: Patient: Joy Juarez 08/08/2017 2:47 PM  Physician:  08/08/2017 2:47 PM  Nursing: Rodell Perna, RN 08/08/2017 2:47 PM  RN Care Manager:  08/08/2017 2:47 PM  Social Worker: Donnelly Stager, LCSWA 08/08/2017 2:47 PM  Recreational Therapist:  08/08/2017 2:47 PM  Other:  08/08/2017 2:47 PM  Other:  08/08/2017 2:47 PM  Other: 08/08/2017 2:47 PM  Scribe for Treatment Team: Jonathon Jordan, MSW,LCSWA 08/08/2017 2:47 PM

## 2017-08-08 NOTE — Progress Notes (Signed)
Recreation Therapy Notes  Date: 08/08/17 Time: 0930 Location: [address] Dayroom  Group Topic: Stress Management  Goal Area(s) Addresses:  Patient will verbalize importance of using healthy stress management.  Patient will identify positive emotions associated with healthy stress management.   Intervention: Stress Management  Activity :  Progressive Muscle Relaxation.  LRT introduced the stress management technique of progressive muscle relaxation.  LRT read a script that instructed patients to tense then relax each muscle group individually.  Patients were to follow along as the script was read to fully engage in the practice.  Education:  Stress Management, Discharge Planning.   Education Outcome: Acknowledges edcuation/In group clarification offered/Needs additional education  Clinical Observations/Feedback: Pt did not attend group.    Joscelyne Renville, LRT/CTRS         Crist Kruszka A 08/08/2017 12:26 PM 
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
## 2017-08-08 NOTE — Progress Notes (Signed)
Patient did attend the evening speaker NA meeting.  

## 2017-08-08 NOTE — Progress Notes (Signed)
D: Pt presents with a flat affect and depressed mood. Pt appears to be minimizing her symptoms. Pt guarded, cautious and forwarded little information during shift assessment. Pt appears withdrawn and isolates in her room. Pt has not attended any scheduled groups today. Pt denies any withdrawal symptoms. Pt  stated that she relapsed on cocaine and that's why she's here in the hosp. Pt seeking long term substance abuse tx. Pt k+ level 4.2 this morning. Per Dr. Jama Flavors, writer may d/c remaining doses of Potassium Chloride.  A: Medications reviewed with pt. Medications administered as ordered per MD. Verbal support provided. Pt encouraged to attend groups. 15 minute checks performed for safety.  R: Pt noncompliant with tx.

## 2017-08-09 DIAGNOSIS — F419 Anxiety disorder, unspecified: Secondary | ICD-10-CM

## 2017-08-09 LAB — T3, FREE: T3 FREE: 2.6 pg/mL (ref 2.0–4.4)

## 2017-08-09 MED ORDER — HYDROXYZINE HCL 50 MG PO TABS: 50.0000 mg | ORAL_TABLET | Freq: Three times a day (TID) | ORAL | Status: DC | PRN

## 2017-08-09 MED ORDER — RAMELTEON 8 MG PO TABS
8.0000 mg | ORAL_TABLET | Freq: Every day | ORAL | Status: DC
  Administered 2017-08-09: 8 mg via ORAL
  Filled 2017-08-09 (×5): qty 1

## 2017-08-09 NOTE — [facility] Suicide Risk Assessment (Signed)
[facility] INPATIENT:  Family/Significant Other Suicide Prevention Education  Suicide Prevention Education:  Contact Attempts: Joy Juarez (mother [phone]), has been identified by the patient as the family member/significant other with whom the patient will be residing, and identified as the person(s) who will aid the patient in the event of a mental health crisis.  With written consent from the patient, two attempts were made to provide suicide prevention education, prior to and/or following the patient's discharge.  We were unsuccessful in providing suicide prevention education.  A suicide education pamphlet was given to the patient to share with family/significant other.  Date and time of first attempt: 08/09/17 at 11:04am. VM was not set up so CSW was unable to leave a message. Will try again later.   Jonathon Jordan, MSW, LCSWA  08/09/2017, 11:05 AM

## 2017-08-09 NOTE — [facility] Suicide Risk Assessment (Signed)
[facility] INPATIENT:  Family/Significant Other Suicide Prevention Education  Suicide Prevention Education:  Education Completed; Raynelle Chary (mother [phone]), has been identified by the patient as the family member/significant other with whom the patient will be residing, and identified as the person(s) who will aid the patient in the event of a mental health crisis (suicidal ideations/suicide attempt).  With written consent from the patient, the family member/significant other has been provided the following suicide prevention education, prior to the and/or following the discharge of the patient.  The suicide prevention education provided includes the following:  Suicide risk factors  Suicide prevention and interventions  National Suicide Hotline telephone number  [facility] assessment telephone number  [facility] Emergency Assistance 911  [facility] and/or Residential Mobile Crisis Unit telephone number  Request made of family/significant other to:  Remove weapons (e.g., guns, rifles, knives), all items previously/currently identified as safety concern.    Remove drugs/medications (over-the-counter, prescriptions, illicit drugs), all items previously/currently identified as a safety concern.  The family member/significant other verbalizes understanding of the suicide prevention education information provided.  The family member/significant other agrees to remove the items of safety concern listed above.  Pt's mother states that pt cannot return home. Pt's mother states that the pt has 4 children. One child is in the custody of pt's mother and has been for the past 6 years, one child has been adopted and is living in [place], one child is in the custody of the father, and pt's youngest child (1.5 yr old) is in the foster care system. Pt's mother states that there is a chance that pt can regain custody of the youngest child if she completes a program and remains sober,  but pt will not be able to regain custody of any of the other children. Pt's mother states that pt came to live with her after being released from prison and pt has "thought about no one but herself". Pt would leave for days at a time with no one to care for her children. Pt's mother states that she will not put her granddaughter through the pain of her mother being in and out of her life so she is not allowing pt to come home, even if pt does complete a program. CSW explained that pt may have to discharge to a shelter and pt's mother expressed her understanding.  Jonathon Jordan, MSW, LCSWA  08/09/2017, 2:29 PM

## 2017-08-09 NOTE — Progress Notes (Signed)
Pt has been up and out of her room this evening.  She reports she is feeling better than last evening, but still does not feel well.  She denies SI/HI/AVH.  She feels she is having mild to moderate withdrawal symptoms.  She went to AA group this evening.  She has voiced no needs or concerns, but did request a Vistaril at bedtime which was given.  Pt has been pleasant and cooperative.  Support and encouragement offered.  Discharge plans are in process.  Pt wants to go to long term treatment after her detox.  Safety maintained with q15 minute checks.

## 2017-08-09 NOTE — [facility] Group Notes (Signed)
LCSW Group Therapy Note  08/09/2017 1:15pm  Type of Therapy/Topic:  Group Therapy:  Balance in Life  Participation Level:  Pt invited. Did not attend.  Jonathon Jordan, MSW, LCSWA 08/09/2017 4:22 PM
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
## 2017-08-09 NOTE — Progress Notes (Signed)
Patient denies SI, HI and AVH.  Patient reports increase in mood.  Patient has participated in groups and been compliant with medications.  Patient is free of all behavioral dyscontrol.   Assess patient for safety, offer medications as prescribed, engage patient in 1:1 staff talks.   Continue to monitor as prescribed patient able to contract for safety.  

## 2017-08-10 MED ORDER — FLUOXETINE HCL 20 MG PO CAPS: 20.0000 mg | ORAL_CAPSULE | Freq: Every day | ORAL | 0 refills | Status: DC

## 2017-08-10 MED ORDER — HYDROXYZINE HCL 50 MG PO TABS: 50.0000 mg | ORAL_TABLET | Freq: Three times a day (TID) | ORAL | 0 refills | Status: DC | PRN

## 2017-08-10 MED ORDER — TRAZODONE HCL 50 MG PO TABS: 50.0000 mg | ORAL_TABLET | Freq: Every evening | ORAL | 0 refills | Status: DC | PRN

## 2017-08-10 NOTE — Progress Notes (Signed)
Educated pt on follow up appointment and medications. Pt plan to ready for discharge at 1500. Pt signed all documents.

## 2017-08-10 NOTE — Progress Notes (Signed)
Pt has all belongings and prescription. Walked pt out to lobby.
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
## 2017-08-10 NOTE — Progress Notes (Signed)
  [facility] Adult Case Management Discharge Plan :  Will you be returning to the same living situation after discharge:  No pt is discharging to a shelter and is hopeful for admission to [facility] by Monday. At discharge, do you have transportation home?: Yes,  bus passes provided. Do you have the ability to pay for your medications: Yes, pt has insurance.  Release of information consent forms completed and in the chart;  Patient's signature needed at discharge.  Patient to Follow up at: Follow-up Information    [facility] Follow up.   Specialty:  Addiction Medicine Why:  Social worker has made a referral on your behalf. Please call over the weekend to complete a phone screening with Melissa or Shayla. Then continue to call to check for bed availability. Possible openings for Monday. Contact information: [address] [place] [address] [phone]        [facility] Follow up on 08/16/2017.   Why:  Hospital discharge appointment 9/27 at 8am. Please call the office if you need to cancel or reschedule yoru appointment. Thank you. Contact information: [address] [place] [address] [phone]           Next level of care provider has access to [facility] Link:no  Safety Planning and Suicide Prevention discussed: Yes,  with pt and with pt's mother.  Have you used any form of tobacco in the last 30 days? (Cigarettes, Smokeless Tobacco, Cigars, and/or Pipes): Yes  Has patient been referred to the Quitline?: Patient refused referral  Patient has been referred for addiction treatment: Yes  Jonathon Jordan, MSW, LCSWA 08/10/2017, 10:04 AM

## 2017-08-10 NOTE — Progress Notes (Signed)
Pt reports she has had a good day and feels better than she has in a long time.  She denies SI/HI/AVH.  She has not c/o any withdrawal symptoms this evening.  She has been in the dayroom most of the evening.  She was visited by her Case Worker outside the hospital tonight, who pt says did not give her encouraging news, but pt says that she will not let it discourage her from her recovery.  She has been pleasant and appropriate.  She makes her needs known to staff.  Her only concern is that she can get a better night sleep.  She is aware that the NP changed her sleep aid for tonight.  Support and encouragement offered.  Discharge plans are in process.  Safety maintained with q15 minute checks.

## 2017-08-10 NOTE — Progress Notes (Signed)
Pt A/O, no noted distress. Denies SI/HI/AVH.  Pt stated her plans going to long-term rehab. Pt stated attend evening groups. Staff will continue to monitor, meet needs, and maintain safety.

## 2017-08-10 NOTE — Progress Notes (Signed)
Recreation Therapy Notes  Date: 08/10/17 Time: 0930 Location: [address] Group Room  Group Topic: Stress Management  Goal Area(s) Addresses:  Patient will verbalize importance of using healthy stress management.  Patient will identify positive emotions associated with healthy stress management.   Intervention: Stress Management  Activity :  Guided Imagery.  LRT introduced the stress management technique of guided imagery to patients.  LRT read Juarez script that allowed patients to embark on Ju[place] mental vacation to relax and get away from their everyday routine.  Patients were to follow along as the script was read to participate in the activity.  Education:  Stress Management, Discharge Planning.   Education Outcome: Acknowledges edcuation/In group clarification offered/Needs additional education  Clinical Observations/Feedback: Pt did not attend group.   Joy Juarez, LRT/CTRS         Joy Juarez, Joy Juarez 08/10/2017 11:40 AM

## 2017-09-05 ENCOUNTER — Ambulatory Visit: Payer: Self-pay | Admitting: Family Medicine

## 2017-09-09 ENCOUNTER — Encounter (HOSPITAL_COMMUNITY): Payer: Self-pay | Admitting: Emergency Medicine

## 2017-09-09 ENCOUNTER — Emergency Department (HOSPITAL_COMMUNITY)
Admission: EM | Admit: 2017-09-09 | Discharge: 2017-09-09 | Disposition: A | Discharge status: Home / Self Care | Payer: No Typology Code available for payment source | Attending: Emergency Medicine | Admitting: Emergency Medicine

## 2017-09-09 DIAGNOSIS — M549 Dorsalgia, unspecified: Secondary | ICD-10-CM | POA: Insufficient documentation

## 2017-09-09 DIAGNOSIS — M7918 Myalgia, other site: Secondary | ICD-10-CM

## 2017-09-09 DIAGNOSIS — M791 Myalgia, unspecified site: Secondary | ICD-10-CM | POA: Diagnosis not present

## 2017-09-09 DIAGNOSIS — F17210 Nicotine dependence, cigarettes, uncomplicated: Secondary | ICD-10-CM | POA: Diagnosis not present

## 2017-09-09 DIAGNOSIS — Z79899 Other long term (current) drug therapy: Secondary | ICD-10-CM | POA: Diagnosis not present

## 2017-09-09 MED ORDER — CYCLOBENZAPRINE HCL 5 MG PO TABS: 5.0000 mg | ORAL_TABLET | Freq: Three times a day (TID) | ORAL | 0 refills | Status: DC | PRN

## 2017-09-09 MED ORDER — TRAMADOL HCL 50 MG PO TABS: 50.0000 mg | ORAL_TABLET | Freq: Four times a day (QID) | ORAL | 0 refills | Status: DC | PRN

## 2017-09-09 MED ORDER — IBUPROFEN 600 MG PO TABS: 600.0000 mg | ORAL_TABLET | Freq: Four times a day (QID) | ORAL | 0 refills | Status: DC | PRN
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
## 2017-09-09 NOTE — ED Notes (Signed)
DC instructions reviewed  Pt very terse receiving DC instructions
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
## 2017-09-09 NOTE — ED Notes (Signed)
Pt on call light and inquires how long it will be until she is seen

## 2018-08-05 ENCOUNTER — Emergency Department (HOSPITAL_COMMUNITY)
Admission: EM | Admit: 2018-08-05 | Discharge: 2018-08-05 | Disposition: A | Discharge status: Home / Self Care | Payer: Medicaid Other | Attending: Emergency Medicine | Admitting: Emergency Medicine

## 2018-08-05 ENCOUNTER — Emergency Department (HOSPITAL_COMMUNITY)
Admission: EM | Admit: 2018-08-05 | Discharge: 2018-08-05 | Discharge status: Against Medical Advice | Payer: No Typology Code available for payment source

## 2018-08-05 ENCOUNTER — Encounter (HOSPITAL_COMMUNITY): Payer: Self-pay | Admitting: Emergency Medicine

## 2018-08-05 ENCOUNTER — Other Ambulatory Visit: Payer: Self-pay

## 2018-08-05 DIAGNOSIS — R079 Chest pain, unspecified: Secondary | ICD-10-CM | POA: Insufficient documentation

## 2018-08-05 DIAGNOSIS — F17210 Nicotine dependence, cigarettes, uncomplicated: Secondary | ICD-10-CM | POA: Insufficient documentation

## 2018-08-05 NOTE — Discharge Instructions (Signed)

## 2018-08-05 NOTE — ED Notes (Signed)
Pt ambulated down hallway to room with steady gait, denies shortness of breath and dizziness at this time.
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
## 2018-08-05 NOTE — ED Notes (Signed)
Called from lobby; no answer 
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
## 2018-08-11 ENCOUNTER — Emergency Department (HOSPITAL_COMMUNITY): Payer: No Typology Code available for payment source

## 2018-08-11 ENCOUNTER — Emergency Department (HOSPITAL_COMMUNITY)
Admission: EM | Admit: 2018-08-11 | Discharge: 2018-08-11 | Disposition: A | Discharge status: Home / Self Care | Payer: No Typology Code available for payment source | Attending: Emergency Medicine | Admitting: Emergency Medicine

## 2018-08-11 ENCOUNTER — Other Ambulatory Visit: Payer: Self-pay

## 2018-08-11 ENCOUNTER — Encounter (HOSPITAL_COMMUNITY): Payer: Self-pay | Admitting: Emergency Medicine

## 2018-08-11 DIAGNOSIS — F17210 Nicotine dependence, cigarettes, uncomplicated: Secondary | ICD-10-CM | POA: Diagnosis not present

## 2018-08-11 DIAGNOSIS — Y92410 Unspecified street and highway as the place of occurrence of the external cause: Secondary | ICD-10-CM | POA: Insufficient documentation

## 2018-08-11 DIAGNOSIS — Y9389 Activity, other specified: Secondary | ICD-10-CM | POA: Insufficient documentation

## 2018-08-11 DIAGNOSIS — S3992XA Unspecified injury of lower back, initial encounter: Secondary | ICD-10-CM | POA: Diagnosis present

## 2018-08-11 DIAGNOSIS — Y999 Unspecified external cause status: Secondary | ICD-10-CM | POA: Diagnosis not present

## 2018-08-11 DIAGNOSIS — S39012A Strain of muscle, fascia and tendon of lower back, initial encounter: Secondary | ICD-10-CM

## 2018-08-11 LAB — POC URINE PREG, ED: PREG TEST UR: NEGATIVE

## 2018-08-11 MED ORDER — IBUPROFEN 800 MG PO TABS
800.0000 mg | ORAL_TABLET | Freq: Once | ORAL | Status: AC
  Administered 2018-08-11: 800 mg via ORAL
  Filled 2018-08-11: qty 1

## 2018-08-11 MED ORDER — IBUPROFEN 600 MG PO TABS: 600.0000 mg | ORAL_TABLET | Freq: Four times a day (QID) | ORAL | 0 refills | Status: AC | PRN

## 2018-08-11 MED ORDER — CYCLOBENZAPRINE HCL 5 MG PO TABS: 5.0000 mg | ORAL_TABLET | Freq: Three times a day (TID) | ORAL | 0 refills | Status: AC | PRN

## 2018-08-11 NOTE — ED Notes (Signed)
Pt given ginger ale.
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
## 2018-08-11 NOTE — Discharge Instructions (Addendum)
Your xrays are normal tonight. I suspect your have muscle strain causing the discomfort.  Use a heating pad 20 minutes 3 times daily.  Take the other medicines as prescribed for pain and muscle spasm.  Get rechecked for any new or worsened symptoms your xrays and exam are reassuring. I suspect your symptoms will resolve with this treatment.
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
## 2020-05-21 ENCOUNTER — Encounter (HOSPITAL_COMMUNITY): Payer: Self-pay | Admitting: *Deleted

## 2020-05-21 ENCOUNTER — Other Ambulatory Visit: Payer: Self-pay

## 2020-05-21 ENCOUNTER — Emergency Department (HOSPITAL_COMMUNITY)
Admission: EM | Admit: 2020-05-21 | Discharge: 2020-05-21 | Disposition: A | Discharge status: Home / Self Care | Payer: Medicaid Other | Attending: Emergency Medicine | Admitting: Emergency Medicine

## 2020-05-21 ENCOUNTER — Emergency Department (HOSPITAL_COMMUNITY): Payer: Medicaid Other

## 2020-05-21 DIAGNOSIS — F17210 Nicotine dependence, cigarettes, uncomplicated: Secondary | ICD-10-CM | POA: Insufficient documentation

## 2020-05-21 DIAGNOSIS — L0291 Cutaneous abscess, unspecified: Secondary | ICD-10-CM | POA: Diagnosis not present

## 2020-05-21 DIAGNOSIS — Z5329 Procedure and treatment not carried out because of patient's decision for other reasons: Secondary | ICD-10-CM

## 2020-05-21 MED ORDER — MORPHINE SULFATE (PF) 4 MG/ML IV SOLN: 4.0000 mg | Freq: Once | INTRAVENOUS | Status: DC

## 2020-05-21 MED ORDER — VANCOMYCIN HCL IN DEXTROSE 1-5 GM/200ML-% IV SOLN: 1000.0000 mg | Freq: Once | INTRAVENOUS | Status: DC

## 2020-05-21 MED ORDER — LIDOCAINE-EPINEPHRINE (PF) 2 %-1:200000 IJ SOLN
10.0000 mL | Freq: Once | INTRAMUSCULAR | Status: DC
  Filled 2020-05-21: qty 10

## 2020-05-21 MED ORDER — LEVOFLOXACIN IN D5W 750 MG/150ML IV SOLN: 750.0000 mg | Freq: Once | INTRAVENOUS | Status: DC

## 2020-05-21 MED ORDER — LACTATED RINGERS IV BOLUS (SEPSIS): 1000.0000 mL | Freq: Once | INTRAVENOUS | Status: DC

## 2020-05-21 MED ORDER — SODIUM CHLORIDE 0.9 % IV SOLN: 2.0000 g | Freq: Once | INTRAVENOUS | Status: DC
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
## 2020-05-21 NOTE — ED Triage Notes (Signed)
Pain and swelling left upper arm, area is red and has a history of MRSA

## 2020-05-21 NOTE — ED Notes (Signed)
Patient refused labs from phlebotomy and chest xray.

## 2020-05-21 NOTE — ED Notes (Signed)
This nurse attempt to start IV, patient care, when patient jumped and began crying stating "you all are mean, I am leaving, give me a phone so I can call my ride".

## 2022-01-09 ENCOUNTER — Emergency Department (HOSPITAL_COMMUNITY)
Admission: EM | Admit: 2022-01-09 | Discharge: 2022-01-09 | Discharge status: Against Medical Advice | Payer: Medicaid Other

## 2023-08-19 ENCOUNTER — Other Ambulatory Visit: Payer: Self-pay

## 2023-08-19 ENCOUNTER — Encounter (HOSPITAL_COMMUNITY): Payer: Self-pay

## 2023-08-19 ENCOUNTER — Emergency Department (HOSPITAL_COMMUNITY)
Admission: EM | Admit: 2023-08-19 | Discharge: 2023-08-19 | Discharge status: Against Medical Advice | Payer: Medicaid Other | Attending: Emergency Medicine | Admitting: Emergency Medicine

## 2023-08-19 DIAGNOSIS — F17200 Nicotine dependence, unspecified, uncomplicated: Secondary | ICD-10-CM | POA: Insufficient documentation

## 2023-08-19 DIAGNOSIS — M542 Cervicalgia: Secondary | ICD-10-CM | POA: Diagnosis present

## 2023-08-19 DIAGNOSIS — Z5321 Procedure and treatment not carried out due to patient leaving prior to being seen by health care provider: Secondary | ICD-10-CM | POA: Insufficient documentation
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
## 2023-08-20 ENCOUNTER — Emergency Department (HOSPITAL_COMMUNITY): Payer: Medicaid Other

## 2023-08-20 ENCOUNTER — Emergency Department (HOSPITAL_COMMUNITY)
Admission: EM | Admit: 2023-08-20 | Discharge: 2023-08-20 | Discharge status: Against Medical Advice | Payer: Medicaid Other | Attending: Emergency Medicine | Admitting: Emergency Medicine

## 2023-08-20 ENCOUNTER — Encounter (HOSPITAL_COMMUNITY): Payer: Self-pay | Admitting: Emergency Medicine

## 2023-08-20 ENCOUNTER — Other Ambulatory Visit: Payer: Self-pay

## 2023-08-20 DIAGNOSIS — Z5321 Procedure and treatment not carried out due to patient leaving prior to being seen by health care provider: Secondary | ICD-10-CM | POA: Diagnosis not present

## 2023-08-20 DIAGNOSIS — W109XXA Fall (on) (from) unspecified stairs and steps, initial encounter: Secondary | ICD-10-CM | POA: Diagnosis not present

## 2023-08-20 DIAGNOSIS — M542 Cervicalgia: Secondary | ICD-10-CM | POA: Insufficient documentation

## 2023-08-20 DIAGNOSIS — F410 Panic disorder [episodic paroxysmal anxiety] without agoraphobia: Secondary | ICD-10-CM | POA: Diagnosis present

## 2023-08-20 LAB — CBC
HCT: 30 % — ABNORMAL LOW (ref 36.0–46.0)
Hemoglobin: 9.8 g/dL — ABNORMAL LOW (ref 12.0–15.0)
MCH: 33.6 pg (ref 26.0–34.0)
MCHC: 32.7 g/dL (ref 30.0–36.0)
MCV: 102.7 fL — ABNORMAL HIGH (ref 80.0–100.0)
Platelets: 165 10*3/uL (ref 150–400)
RBC: 2.92 MIL/uL — ABNORMAL LOW (ref 3.87–5.11)
RDW: 14.1 % (ref 11.5–15.5)
WBC: 5.1 10*3/uL (ref 4.0–10.5)
nRBC: 0 % (ref 0.0–0.2)

## 2023-08-20 LAB — TROPONIN I (HIGH SENSITIVITY): Troponin I (High Sensitivity): 7 ng/L (ref <18)

## 2023-08-20 LAB — BASIC METABOLIC PANEL
Anion gap: 11 (ref 5–15)
BUN: 23 mg/dL — ABNORMAL HIGH (ref 6–20)
CO2: 23 mmol/L (ref 22–32)
Calcium: 8.7 mg/dL — ABNORMAL LOW (ref 8.9–10.3)
Chloride: 100 mmol/L (ref 98–111)
Creatinine, Ser: 1.13 mg/dL — ABNORMAL HIGH (ref 0.44–1.00)
GFR, Estimated: >60 mL/min (ref >60)
Glucose, Bld: 102 mg/dL — ABNORMAL HIGH (ref 70–99)
Potassium: 3.9 mmol/L (ref 3.5–5.1)
Sodium: 134 mmol/L — ABNORMAL LOW (ref 135–145)
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
## 2025-01-27 ENCOUNTER — Ambulatory Visit: Payer: Self-pay | Admitting: Family Medicine
